# Patient Record
Sex: Female | Born: 1955
Health system: Southern US, Community
[De-identification: ages and names within clinical notes are randomized; demographics above are authoritative.]

## PROBLEM LIST (undated history)

## (undated) DIAGNOSIS — Z8669 Personal history of other diseases of the nervous system and sense organs: Secondary | ICD-10-CM

## (undated) DIAGNOSIS — K635 Polyp of colon: Secondary | ICD-10-CM

## (undated) DIAGNOSIS — I10 Essential (primary) hypertension: Secondary | ICD-10-CM

## (undated) DIAGNOSIS — M1990 Unspecified osteoarthritis, unspecified site: Secondary | ICD-10-CM

## (undated) DIAGNOSIS — F32A Depression, unspecified: Secondary | ICD-10-CM

## (undated) DIAGNOSIS — Z8619 Personal history of other infectious and parasitic diseases: Secondary | ICD-10-CM

## (undated) HISTORY — DX: Unspecified osteoarthritis, unspecified site: M19.90

## (undated) HISTORY — PX: COLONOSCOPY: SHX174

## (undated) HISTORY — DX: Polyp of colon: K63.5

## (undated) HISTORY — DX: Personal history of other infectious and parasitic diseases: Z86.19

## (undated) HISTORY — DX: Depression, unspecified: F32.A

## (undated) HISTORY — DX: Personal history of other diseases of the nervous system and sense organs: Z86.69

## (undated) HISTORY — DX: Essential (primary) hypertension: I10

---

## 2000-02-21 ENCOUNTER — Encounter: Payer: Self-pay | Admitting: Obstetrics and Gynecology

## 2000-02-21 ENCOUNTER — Encounter: Admission: RE | Admit: 2000-02-21 | Discharge: 2000-02-21 | Payer: Self-pay | Admitting: Obstetrics and Gynecology

## 2003-01-08 ENCOUNTER — Encounter: Admission: RE | Admit: 2003-01-08 | Discharge: 2003-01-08 | Payer: Self-pay | Admitting: Obstetrics and Gynecology

## 2003-01-08 ENCOUNTER — Encounter: Payer: Self-pay | Admitting: Obstetrics and Gynecology

## 2008-06-27 DIAGNOSIS — K635 Polyp of colon: Secondary | ICD-10-CM

## 2008-06-27 HISTORY — DX: Polyp of colon: K63.5

## 2011-09-06 ENCOUNTER — Ambulatory Visit (INDEPENDENT_AMBULATORY_CARE_PROVIDER_SITE_OTHER): Payer: 59 | Admitting: Internal Medicine

## 2011-09-06 ENCOUNTER — Ambulatory Visit (HOSPITAL_BASED_OUTPATIENT_CLINIC_OR_DEPARTMENT_OTHER)
Admission: RE | Admit: 2011-09-06 | Discharge: 2011-09-06 | Disposition: A | Discharge status: Home / Self Care | Payer: 59 | Source: Ambulatory Visit | Attending: Internal Medicine | Admitting: Internal Medicine

## 2011-09-06 ENCOUNTER — Encounter: Payer: Self-pay | Admitting: Internal Medicine

## 2011-09-06 DIAGNOSIS — R053 Chronic cough: Secondary | ICD-10-CM

## 2011-09-06 DIAGNOSIS — Z79899 Other long term (current) drug therapy: Secondary | ICD-10-CM

## 2011-09-06 DIAGNOSIS — R05 Cough: Secondary | ICD-10-CM | POA: Insufficient documentation

## 2011-09-06 DIAGNOSIS — R059 Cough, unspecified: Secondary | ICD-10-CM

## 2011-09-06 DIAGNOSIS — I10 Essential (primary) hypertension: Secondary | ICD-10-CM

## 2011-09-06 DIAGNOSIS — D126 Benign neoplasm of colon, unspecified: Secondary | ICD-10-CM

## 2011-09-06 DIAGNOSIS — M4120 Other idiopathic scoliosis, site unspecified: Secondary | ICD-10-CM | POA: Insufficient documentation

## 2011-09-06 DIAGNOSIS — K635 Polyp of colon: Secondary | ICD-10-CM | POA: Insufficient documentation

## 2011-09-06 LAB — BASIC METABOLIC PANEL
BUN: 14 mg/dL (ref 6–23)
CO2: 27 mEq/L (ref 19–32)
Calcium: 9.9 mg/dL (ref 8.4–10.5)
Chloride: 101 mEq/L (ref 96–112)
Creat: 0.89 mg/dL (ref 0.50–1.10)
Glucose, Bld: 93 mg/dL (ref 70–99)
Potassium: 4.6 mEq/L (ref 3.5–5.3)
Sodium: 141 mEq/L (ref 135–145)

## 2011-09-06 LAB — TSH: TSH: 2.046 u[IU]/mL (ref 0.350–4.500)

## 2011-09-06 MED ORDER — FLUTICASONE PROPIONATE 50 MCG/ACT NA SUSP: 2.0000 | Freq: Every day | NASAL | Status: DC

## 2011-09-06 MED ORDER — METHYLPREDNISOLONE 4 MG PO KIT: PACK | ORAL | Status: AC

## 2011-09-06 NOTE — Assessment & Plan Note (Signed)
Repeat cxr given previous abnormal findings. Suspect contribution to cough from rhinitis. Attempt medrol dosepak and flonase. F/u in 3 wks for re-evaluation.

## 2011-09-06 NOTE — Progress Notes (Signed)
  Subjective:    Patient ID: Wendy Chambers, female    DOB: [birth year], 56 y.o.   MRN: [id number]  HPI Pt presents to clinic for evaluation of chronic cough and HTN. Known h/o htn well controlled with hctz without adverse effect. bp reviewed normotensive. No known hypokalemia complicating treatment. States 2 year h/o chronic cough intermittently productive without hemoptysis. Past smoker quit in 1990's. Evaluation has included CXR which she was told showed hyperinflation and possibly "fibrosis."  PFT's obtained and suggested "borderline copd." using no inhalers. States was given course of biaxin for sinusitis and noticed the cough improved. Also now taking claritin for 2wks and notes possible improvement of cough. Cough worsens sometimes with strong smells and may have begun after starting her current job. Wonders if mold is in workplace. Denies GERD sx's other than rarely. Mamm/pap utd 2012. H/o colon polyps with 2010 colonoscopy (5 y recommended f/u.) chol reportedly nl.  Past Medical History  Diagnosis Date  . Asthma   . History of chicken pox     childhood  . Hypertension   . History of migraine   . Colon polyp 2010   Past Surgical History  Procedure Date  . Cesarean section 2010    reports that she has quit smoking. She has never used smokeless tobacco. She reports that she drinks alcohol. She reports that she does not use illicit drugs. family history includes Heart disease in her paternal grandmother; Hyperlipidemia in her father and mother; Hypertension in her father; and Lung cancer in her father. Allergies  Allergen Reactions  . Tetracyclines & Related     Rash     Review of Systems  Constitutional: Negative for fever and chills.  Respiratory: Positive for cough. Negative for shortness of breath.   All other systems reviewed and are negative.       Objective:   Physical Exam  Nursing note and vitals reviewed. Constitutional: She appears well-developed and  well-nourished. No distress.  HENT:  Head: Normocephalic and atraumatic.  Right Ear: External ear normal.  Left Ear: External ear normal.  Nose: Mucosal edema and rhinorrhea present.  Mouth/Throat: Oropharynx is clear and moist. No oropharyngeal exudate.  Eyes: Conjunctivae are normal. Right eye exhibits no discharge. Left eye exhibits no discharge. No scleral icterus.  Neck: Neck supple. Carotid bruit is not present. No thyromegaly present.  Cardiovascular: Normal rate, regular rhythm and normal heart sounds.  Exam reveals no gallop and no friction rub.   No murmur heard. Pulmonary/Chest: Effort normal and breath sounds normal. No respiratory distress. She has no wheezes. She has no rales.  Lymphadenopathy:    She has no cervical adenopathy.  Neurological: She is alert.  Skin: Skin is warm and dry. She is not diaphoretic.  Psychiatric: She has a normal mood and affect.          Assessment & Plan:

## 2011-09-06 NOTE — Assessment & Plan Note (Signed)
Normotensive and stable. Continue current regimen. Monitor bp as outpt and followup in clinic as scheduled. Obtain cbc, chem7 and tsh  

## 2011-09-07 LAB — CBC WITH DIFFERENTIAL/PLATELET
Basophils Absolute: 0.1 10*3/uL (ref 0.0–0.1)
Basophils Relative: 1 % (ref 0–1)
Eosinophils Absolute: 0.3 10*3/uL (ref 0.0–0.7)
Eosinophils Relative: 6 % — ABNORMAL HIGH (ref 0–5)
HCT: 42.9 % (ref 36.0–46.0)
Hemoglobin: 14.8 g/dL (ref 12.0–15.0)
Lymphocytes Relative: 40 % (ref 12–46)
Lymphs Abs: 2.1 10*3/uL (ref 0.7–4.0)
MCH: 30.5 pg (ref 26.0–34.0)
MCHC: 34.5 g/dL (ref 30.0–36.0)
MCV: 88.5 fL (ref 78.0–100.0)
Monocytes Absolute: 0.6 10*3/uL (ref 0.1–1.0)
Monocytes Relative: 11 % (ref 3–12)
Neutro Abs: 2.2 10*3/uL (ref 1.7–7.7)
Neutrophils Relative %: 42 % — ABNORMAL LOW (ref 43–77)
Platelets: 330 10*3/uL (ref 150–400)
RBC: 4.85 MIL/uL (ref 3.87–5.11)
RDW: 12.7 % (ref 11.5–15.5)
WBC: 5.2 10*3/uL (ref 4.0–10.5)

## 2011-09-09 ENCOUNTER — Telehealth: Payer: Self-pay | Admitting: Internal Medicine

## 2011-09-09 NOTE — Telephone Encounter (Signed)
Received medical records from [facility] at [facility]

## 2011-09-27 ENCOUNTER — Ambulatory Visit (INDEPENDENT_AMBULATORY_CARE_PROVIDER_SITE_OTHER): Payer: 59 | Admitting: Internal Medicine

## 2011-09-27 ENCOUNTER — Telehealth: Payer: Self-pay | Admitting: Internal Medicine

## 2011-09-27 ENCOUNTER — Encounter: Payer: Self-pay | Admitting: Internal Medicine

## 2011-09-27 VITALS — BP 108/72 | HR 69 | Temp 97.9°F | Resp 18 | Wt 206.0 lb

## 2011-09-27 DIAGNOSIS — R053 Chronic cough: Secondary | ICD-10-CM

## 2011-09-27 DIAGNOSIS — I10 Essential (primary) hypertension: Secondary | ICD-10-CM

## 2011-09-27 DIAGNOSIS — R059 Cough, unspecified: Secondary | ICD-10-CM

## 2011-09-27 DIAGNOSIS — R05 Cough: Secondary | ICD-10-CM

## 2011-09-27 DIAGNOSIS — Z79899 Other long term (current) drug therapy: Secondary | ICD-10-CM

## 2011-09-27 MED ORDER — HYDROCHLOROTHIAZIDE 25 MG PO TABS: 25.0000 mg | ORAL_TABLET | Freq: Every day | ORAL | Status: DC

## 2011-09-27 NOTE — Assessment & Plan Note (Signed)
Normotensive and stable. Continue current regimen. Monitor bp as outpt and followup in clinic as scheduled. Obtain cbc, chem7 prior to next visit 

## 2011-09-27 NOTE — Progress Notes (Signed)
  Subjective:    Patient ID: Wendy Chambers, female    DOB: 1956-05-16, 56 y.o.   MRN: [id number]  HPI Pt presents to clinic for followup of multiple medical problems. States cough has almost entirely resolved now. Is taking both claritin and flonase without difficulty. Reviewed cxr results without chronic changes previously suggested by outside xray. bp reviewed normotensive. Cbc, chem7, and tsh reviewed with pt. No active complaints.   Past Medical History  Diagnosis Date  . Asthma   . History of chicken pox     childhood  . Hypertension   . History of migraine   . Colon polyp 2010   Past Surgical History  Procedure Date  . Cesarean section 2010    reports that she has quit smoking. She has never used smokeless tobacco. She reports that she drinks alcohol. She reports that she does not use illicit drugs. family history includes Heart disease in her paternal grandmother; Hyperlipidemia in her father and mother; Hypertension in her father; and Lung cancer in her father. Allergies  Allergen Reactions  . Tetracyclines & Related     Rash      Review of Systems see hpi     Objective:   Physical Exam  Nursing note and vitals reviewed. Constitutional: She appears well-developed and well-nourished.  HENT:  Head: Normocephalic and atraumatic.  Right Ear: External ear normal.  Left Ear: External ear normal.  Neurological: She is alert.  Psychiatric: She has a normal mood and affect.          Assessment & Plan:

## 2011-09-27 NOTE — Assessment & Plan Note (Signed)
Resolving. Continue flonase/claritin for now but consider staged trial off.

## 2011-09-27 NOTE — Patient Instructions (Signed)
Please schedule fasting labs prior to next visit chem7-v58.69, lipid-401.9

## 2011-09-27 NOTE — Telephone Encounter (Signed)
Lab orders entered for September 2013. 

## 2012-03-28 ENCOUNTER — Ambulatory Visit: Payer: 59 | Admitting: Internal Medicine

## 2012-04-16 ENCOUNTER — Ambulatory Visit (INDEPENDENT_AMBULATORY_CARE_PROVIDER_SITE_OTHER): Payer: 59 | Admitting: Internal Medicine

## 2012-04-16 ENCOUNTER — Encounter: Payer: Self-pay | Admitting: Internal Medicine

## 2012-04-16 VITALS — BP 128/98 | HR 63 | Temp 98.0°F | Resp 16 | Ht 67.0 in | Wt 184.0 lb

## 2012-04-16 DIAGNOSIS — I10 Essential (primary) hypertension: Secondary | ICD-10-CM

## 2012-04-16 DIAGNOSIS — F329 Major depressive disorder, single episode, unspecified: Secondary | ICD-10-CM

## 2012-04-16 DIAGNOSIS — F32A Depression, unspecified: Secondary | ICD-10-CM

## 2012-04-16 DIAGNOSIS — Z79899 Other long term (current) drug therapy: Secondary | ICD-10-CM

## 2012-04-16 LAB — BASIC METABOLIC PANEL
BUN: 11 mg/dL (ref 6–23)
Calcium: 9.7 mg/dL (ref 8.4–10.5)
Creat: 0.92 mg/dL (ref 0.50–1.10)

## 2012-04-16 MED ORDER — CITALOPRAM HYDROBROMIDE 20 MG PO TABS: 20.0000 mg | ORAL_TABLET | Freq: Every day | ORAL | Status: DC

## 2012-04-21 DIAGNOSIS — F418 Other specified anxiety disorders: Secondary | ICD-10-CM | POA: Insufficient documentation

## 2012-04-21 NOTE — Assessment & Plan Note (Signed)
Isolated elevation pt relates to stress. Recommend outpt bp log. Obtain chem7

## 2012-04-21 NOTE — Progress Notes (Signed)
  Subjective:    Patient ID: Shanielle Correll, female    DOB: [birth year], 56 y.o.   MRN: [id number]  HPI Pt presents to clinic for followup of multiple medical problems. Notes decreased mood without si/hi. Has work stress but is looking for a new job. BP elevated today but related to this stress. Declines influenza vaccine.  Past Medical History  Diagnosis Date  . Asthma   . History of chicken pox     childhood  . Hypertension   . History of migraine   . Colon polyp 2010   Past Surgical History  Procedure Date  . Cesarean section 2010    reports that she has quit smoking. She has never used smokeless tobacco. She reports that she drinks alcohol. She reports that she does not use illicit drugs. family history includes Heart disease in her paternal grandmother; Hyperlipidemia in her father and mother; Hypertension in her father; and Lung cancer in her father. Allergies  Allergen Reactions  . Tetracyclines & Related     Rash      Review of Systems see hpi     Objective:   Physical Exam  Physical Exam  Nursing note and vitals reviewed. Constitutional: Appears well-developed and well-nourished. No distress.  HENT:  Head: Normocephalic and atraumatic.  Right Ear: External ear normal.  Left Ear: External ear normal.  Eyes: Conjunctivae are normal. No scleral icterus.  Neck: Neck supple. Carotid bruit is not present.  Cardiovascular: Normal rate, regular rhythm and normal heart sounds.  Exam reveals no gallop and no friction rub.   No murmur heard. Pulmonary/Chest: Effort normal and breath sounds normal. No respiratory distress. He has no wheezes. no rales.  Lymphadenopathy:    He has no cervical adenopathy.  Neurological:Alert.  Skin: Skin is warm and dry. Not diaphoretic.  Psychiatric: Has a normal mood and affect. no si/hi.       Assessment & Plan:

## 2012-04-21 NOTE — Assessment & Plan Note (Signed)
Begin celexa 20mg  qd. Schedule close f/u.

## 2012-05-17 ENCOUNTER — Ambulatory Visit: Payer: 59 | Admitting: Internal Medicine

## 2012-05-28 ENCOUNTER — Ambulatory Visit (INDEPENDENT_AMBULATORY_CARE_PROVIDER_SITE_OTHER): Payer: 59 | Admitting: Internal Medicine

## 2012-05-28 ENCOUNTER — Encounter: Payer: Self-pay | Admitting: Internal Medicine

## 2012-05-28 VITALS — BP 124/82 | HR 64 | Temp 98.2°F | Resp 16 | Wt 185.5 lb

## 2012-05-28 DIAGNOSIS — I10 Essential (primary) hypertension: Secondary | ICD-10-CM

## 2012-05-28 DIAGNOSIS — F32A Depression, unspecified: Secondary | ICD-10-CM

## 2012-05-28 DIAGNOSIS — F329 Major depressive disorder, single episode, unspecified: Secondary | ICD-10-CM

## 2012-05-28 MED ORDER — CITALOPRAM HYDROBROMIDE 20 MG PO TABS: 20.0000 mg | ORAL_TABLET | Freq: Every day | ORAL | Status: DC

## 2012-05-28 MED ORDER — CLOBETASOL PROPIONATE 0.05 % EX CREA: 1.0000 "application " | TOPICAL_CREAM | Freq: Two times a day (BID) | CUTANEOUS | Status: DC

## 2012-05-28 NOTE — Progress Notes (Signed)
  Subjective:    Patient ID: Wendy Chambers, female    DOB: Dec 20, 1955, 56 y.o.   MRN: [id number]  HPI Pt presents to clinic for followup of multiple medical problems. Now tolerating ssri. Did initially have nighttime palpitations. Mood improved and sleeping better. BP reviewed normotensive.  Past Medical History  Diagnosis Date  . Asthma   . History of chicken pox     childhood  . Hypertension   . History of migraine   . Colon polyp 2010   Past Surgical History  Procedure Date  . Cesarean section 2010    reports that she has quit smoking. She has never used smokeless tobacco. She reports that she drinks alcohol. She reports that she does not use illicit drugs. family history includes Heart disease in her paternal grandmother; Hyperlipidemia in her father and mother; Hypertension in her father; and Lung cancer in her father. Allergies  Allergen Reactions  . Tetracyclines & Related     Rash      Review of Systems see hpi    Objective:   Physical Exam  Nursing note and vitals reviewed. Constitutional: She appears well-developed and well-nourished. No distress.  HENT:  Head: Normocephalic and atraumatic.  Eyes: Conjunctivae normal are normal. No scleral icterus.  Neurological: She is alert.  Skin: She is not diaphoretic.  Psychiatric: She has a normal mood and affect. Her behavior is normal. Thought content normal.          Assessment & Plan:

## 2012-05-28 NOTE — Addendum Note (Signed)
Addended by: Regis Bill on: 05/28/2012 01:18 PM   Modules accepted: Orders

## 2012-05-28 NOTE — Telephone Encounter (Signed)
Release lab order/SLS

## 2012-05-29 LAB — LIPID PANEL
Cholesterol: 170 mg/dL (ref 0–200)
LDL Cholesterol: 97 mg/dL (ref 0–99)
Triglycerides: 77 mg/dL (ref <150)

## 2012-06-02 NOTE — Assessment & Plan Note (Signed)
Improved. Consider decrease of dose.

## 2012-06-02 NOTE — Assessment & Plan Note (Signed)
Normotensive and stable. Continue current regimen. Monitor bp as outpt and followup in clinic as scheduled.  

## 2012-06-27 LAB — HM COLONOSCOPY

## 2012-07-14 ENCOUNTER — Other Ambulatory Visit: Payer: Self-pay | Admitting: Pulmonary Disease

## 2012-08-31 ENCOUNTER — Ambulatory Visit: Payer: 59 | Admitting: Internal Medicine

## 2012-10-27 ENCOUNTER — Other Ambulatory Visit: Payer: Self-pay | Admitting: Internal Medicine

## 2012-10-29 NOTE — Telephone Encounter (Signed)
Rx sent in to pharmacy. 

## 2013-01-23 ENCOUNTER — Ambulatory Visit: Payer: 59 | Admitting: Family

## 2013-01-23 ENCOUNTER — Ambulatory Visit (INDEPENDENT_AMBULATORY_CARE_PROVIDER_SITE_OTHER): Payer: 59 | Admitting: Family

## 2013-01-23 ENCOUNTER — Encounter: Payer: Self-pay | Admitting: Family

## 2013-01-23 VITALS — BP 128/80 | HR 76 | Temp 98.1°F | Resp 16 | Wt 198.1 lb

## 2013-01-23 DIAGNOSIS — I10 Essential (primary) hypertension: Secondary | ICD-10-CM

## 2013-01-23 DIAGNOSIS — R221 Localized swelling, mass and lump, neck: Secondary | ICD-10-CM

## 2013-01-23 DIAGNOSIS — H60549 Acute eczematoid otitis externa, unspecified ear: Secondary | ICD-10-CM | POA: Insufficient documentation

## 2013-01-23 DIAGNOSIS — H60543 Acute eczematoid otitis externa, bilateral: Secondary | ICD-10-CM

## 2013-01-23 DIAGNOSIS — R220 Localized swelling, mass and lump, head: Secondary | ICD-10-CM

## 2013-01-23 DIAGNOSIS — F32A Depression, unspecified: Secondary | ICD-10-CM

## 2013-01-23 DIAGNOSIS — F329 Major depressive disorder, single episode, unspecified: Secondary | ICD-10-CM

## 2013-01-23 DIAGNOSIS — H60509 Unspecified acute noninfective otitis externa, unspecified ear: Secondary | ICD-10-CM

## 2013-01-23 LAB — BASIC METABOLIC PANEL
BUN: 9 mg/dL (ref 6–23)
CO2: 30 mEq/L (ref 19–32)
Chloride: 102 mEq/L (ref 96–112)
Creat: 0.95 mg/dL (ref 0.50–1.10)
Glucose, Bld: 133 mg/dL — ABNORMAL HIGH (ref 70–99)

## 2013-01-23 MED ORDER — HYDROCHLOROTHIAZIDE 25 MG PO TABS: 25.0000 mg | ORAL_TABLET | Freq: Every day | ORAL | Status: DC

## 2013-01-23 MED ORDER — BETAMETHASONE DIPROPIONATE 0.05 % EX CREA: TOPICAL_CREAM | Freq: Two times a day (BID) | CUTANEOUS | Status: DC | PRN

## 2013-01-23 NOTE — Assessment & Plan Note (Signed)
Recommended diprolene cream prn.  After further chart review, I see that she has rx for temovate which she uses for a derm issue on her foot.  Advised pt that she could use temovate or diprolene for the ear eczema.  She verbalizes understanding.

## 2013-01-23 NOTE — Patient Instructions (Signed)
Complete your lab work prior to leaving. You will be contacted about your referral to ENT. Please follow up in 6 months for a fasting physical.

## 2013-01-23 NOTE — Assessment & Plan Note (Signed)
Stable on hctz, continue same. Obtain bmet.

## 2013-01-23 NOTE — Assessment & Plan Note (Signed)
Enlarging. Will refer to ENT for excision.

## 2013-01-23 NOTE — Progress Notes (Signed)
  Subjective:    Patient ID: Wendy Chambers, female    DOB: [birth year], 57 y.o.   MRN: [id number]  HPI  Wendy Chambers is a 57 yr old female who presents today with several concerns:  1) Cyst behind the left neck- has been present for years and is enlarging.  2) Depression- stopped citalopram due to palpitations. Feels well off of medication.   3) HTN-  Currently maintained on hctz. Denies cp sob or swelling.   4) Ear problem- itching and flaking of the right ear for a few weeks.    Review of Systems See HPI  Past Medical History  Diagnosis Date  . Asthma   . History of chicken pox     childhood  . Hypertension   . History of migraine   . Colon polyp 2010    History   Social History  . Marital Status: Legally Separated    Spouse Name: N/A    Number of Children: N/A  . Years of Education: N/A   Occupational History  . Not on file.   Social History Main Topics  . Smoking status: Former Games developer  . Smokeless tobacco: Never Used     Comment: Quit 1990 - 1 ppd  . Alcohol Use: Yes  . Drug Use: No  . Sexually Active: Not on file   Other Topics Concern  . Not on file   Social History Narrative  . No narrative on file    Past Surgical History  Procedure Laterality Date  . Cesarean section  2010    Family History  Problem Relation Age of Onset  . Lung cancer Father   . Hyperlipidemia Mother   . Hyperlipidemia Father   . Heart disease Paternal Grandmother   . Hypertension Father     Allergies  Allergen Reactions  . Tetracyclines & Related     Rash    Current Outpatient Prescriptions on File Prior to Visit  Medication Sig Dispense Refill  . citalopram (CELEXA) 20 MG tablet Take 1 tablet (20 mg total) by mouth daily.  30 tablet  6  . clobetasol cream (TEMOVATE) 0.05 % Apply 1 application topically 2 (two) times daily.  30 g  1  . fluticasone (FLONASE) 50 MCG/ACT nasal spray USE 2 SPRAYS EACH NOSTRIL DAILY  16 g  5  . hydrochlorothiazide  (HYDRODIURIL) 25 MG tablet Take 1 tablet (25 mg total) by mouth daily.  90 tablet  0   No current facility-administered medications on file prior to visit.    BP 128/80  Pulse 76  Temp(Src) 98.1 F (36.7 C) (Oral)  Resp 16  Wt 198 lb 1.9 oz (89.867 kg)  BMI 31.02 kg/m2  SpO2 99%       Objective:   Physical Exam  Constitutional: She appears well-developed and well-nourished. No distress.  Eyes: No scleral icterus.  Neck: Normal range of motion. Neck supple.  Firm, mobile, rubbery neck mass left posterior neck.  Skin:  Dry flaking skin noted bilateral ear canals and behind right Pinna          Assessment & Plan:

## 2013-01-23 NOTE — Assessment & Plan Note (Signed)
Stable off of meds, monitor.  

## 2013-05-02 ENCOUNTER — Other Ambulatory Visit: Payer: Self-pay

## 2013-06-18 ENCOUNTER — Ambulatory Visit (INDEPENDENT_AMBULATORY_CARE_PROVIDER_SITE_OTHER): Payer: 59 | Admitting: Family

## 2013-06-18 ENCOUNTER — Encounter: Payer: Self-pay | Admitting: Family

## 2013-06-18 VITALS — BP 140/80 | HR 62 | Temp 98.2°F | Resp 16 | Ht 67.0 in | Wt 206.0 lb

## 2013-06-18 DIAGNOSIS — R220 Localized swelling, mass and lump, head: Secondary | ICD-10-CM

## 2013-06-18 DIAGNOSIS — M25512 Pain in left shoulder: Secondary | ICD-10-CM

## 2013-06-18 DIAGNOSIS — R221 Localized swelling, mass and lump, neck: Secondary | ICD-10-CM

## 2013-06-18 DIAGNOSIS — M25519 Pain in unspecified shoulder: Secondary | ICD-10-CM

## 2013-06-18 MED ORDER — MELOXICAM 7.5 MG PO TABS: 7.5000 mg | ORAL_TABLET | Freq: Every day | ORAL | Status: DC

## 2013-06-18 NOTE — Assessment & Plan Note (Signed)
Due to duration of symptoms, Will refer to sports medicine. Rx with meloxicam.

## 2013-06-18 NOTE — Progress Notes (Signed)
Pre visit review using our clinic review tool, if applicable. No additional management support is needed unless otherwise documented below in the visit note. 

## 2013-06-18 NOTE — Progress Notes (Signed)
   Subjective:    Patient ID: Wendy Chambers, female    DOB: 12/16/1955, 57 y.o.   MRN: [id number]  HPI  Wendy Chambers is a 57 yr old female who presents today with chief complaint of shoulder pain.  Pain is located in the left shoulder and has been present x 3-4 months.  Denies known injury.  Reports pain is worse when she pulls her left arm back Has used advil without much improvement in her symptoms.   Neck mass- She did not keep her appointment with ENT.  Mass is unchanged.    Review of Systems See HPI  Past Medical History  Diagnosis Date  . Asthma   . History of chicken pox     childhood  . Hypertension   . History of migraine   . Colon polyp 2010    History   Social History  . Marital Status: Legally Separated    Spouse Name: N/A    Number of Children: N/A  . Years of Education: N/A   Occupational History  . Not on file.   Social History Main Topics  . Smoking status: Former Games developer  . Smokeless tobacco: Never Used     Comment: Quit 1990 - 1 ppd  . Alcohol Use: Yes  . Drug Use: No  . Sexual Activity: Not on file   Other Topics Concern  . Not on file   Social History Narrative  . No narrative on file    Past Surgical History  Procedure Laterality Date  . Cesarean section  2010    Family History  Problem Relation Age of Onset  . Lung cancer Father   . Hyperlipidemia Mother   . Hyperlipidemia Father   . Heart disease Paternal Grandmother   . Hypertension Father     Allergies  Allergen Reactions  . Tetracyclines & Related     Rash    Current Outpatient Prescriptions on File Prior to Visit  Medication Sig Dispense Refill  . hydrochlorothiazide (HYDRODIURIL) 25 MG tablet Take 1 tablet (25 mg total) by mouth daily.  90 tablet  1   No current facility-administered medications on file prior to visit.    BP 140/80  Pulse 62  Temp(Src) 98.2 F (36.8 C) (Oral)  Resp 16  Ht 5\' 7"  (1.702 m)  Wt 206 lb (93.441 kg)  BMI 32.26 kg/m2  SpO2  99%       Objective:   Physical Exam  Constitutional: She appears well-developed and well-nourished. No distress.  Neck:  Firm, mobile, rubbery neck mass left posterior neck.   Cardiovascular: Normal rate and regular rhythm.   No murmur heard. Pulmonary/Chest: Effort normal and breath sounds normal. No respiratory distress. She has no wheezes. She has no rales. She exhibits no tenderness.  Musculoskeletal:       Left shoulder: She exhibits decreased range of motion. She exhibits no tenderness, no swelling, no effusion and no crepitus.          Assessment & Plan:

## 2013-06-18 NOTE — Patient Instructions (Signed)
You will be contacted about your referral to sports medicine.  Please let us know if you have not heard back within 1 week about your referral. Please reschedule you appointment with ENT at your earliest convenience- Dr. Verne Spurr [phone]

## 2013-06-18 NOTE — Assessment & Plan Note (Signed)
Unchanged. Advised pt to reschedule ENT appointment at her her earliest convenience.

## 2013-06-25 ENCOUNTER — Encounter: Payer: Self-pay | Admitting: Family Medicine

## 2013-06-25 ENCOUNTER — Ambulatory Visit (INDEPENDENT_AMBULATORY_CARE_PROVIDER_SITE_OTHER): Payer: 59 | Admitting: Family Medicine

## 2013-06-25 VITALS — BP 140/90 | HR 79 | Ht 67.0 in | Wt 205.0 lb

## 2013-06-25 DIAGNOSIS — M25512 Pain in left shoulder: Secondary | ICD-10-CM

## 2013-06-25 DIAGNOSIS — M25519 Pain in unspecified shoulder: Secondary | ICD-10-CM

## 2013-06-25 MED ORDER — MELOXICAM 7.5 MG PO TABS: 7.5000 mg | ORAL_TABLET | Freq: Every day | ORAL | Status: DC

## 2013-06-25 NOTE — Patient Instructions (Signed)
Your exam is consistent with a degenerative labral tear of your shoulder. Try to avoid painful activities as much as possible. Meloxicam with food for pain and inflammation. Can take tylenol in addition to this. Do home exercise program with theraband and scapular stabilization exercises daily 3 sets of 10. If not improving at follow-up we will consider further imaging, physical therapy, injection.

## 2013-06-26 ENCOUNTER — Encounter: Payer: Self-pay | Admitting: Family Medicine

## 2013-06-27 ENCOUNTER — Encounter: Payer: Self-pay | Admitting: Family Medicine

## 2013-06-27 NOTE — Progress Notes (Signed)
Patient ID: Andre Gallego, female   DOB: [birth year], 58 y.o.   MRN: [id number]  PCP: Nance Pear., NP  Subjective:   HPI: Patient is a 58 y.o. female here for left shoulder pain.  Patient reports she's had left shoulder pain for about 4 months. Thought she slept on shoulder wrong. No injury or trauma. Pain primarily when reaching behind her. Pushing up out of a chair also painful. No numbness or tingling. Tried ibuprofen and meloxicam. No prior issues. She is right handed.  Past Medical History  Diagnosis Date  . Asthma   . History of chicken pox     childhood  . Hypertension   . History of migraine   . Colon polyp 2010    Current Outpatient Prescriptions on File Prior to Visit  Medication Sig Dispense Refill  . hydrochlorothiazide (HYDRODIURIL) 25 MG tablet Take 1 tablet (25 mg total) by mouth daily.  90 tablet  1   No current facility-administered medications on file prior to visit.    Past Surgical History  Procedure Laterality Date  . Cesarean section  2010    Allergies  Allergen Reactions  . Tetracyclines & Related     Rash    History   Social History  . Marital Status: Legally Separated    Spouse Name: N/A    Number of Children: N/A  . Years of Education: N/A   Occupational History  . Not on file.   Social History Main Topics  . Smoking status: Former Research scientist (life sciences)  . Smokeless tobacco: Never Used     Comment: Quit 1990 - 1 ppd  . Alcohol Use: Yes  . Drug Use: No  . Sexual Activity: Not on file   Other Topics Concern  . Not on file   Social History Narrative  . No narrative on file    Family History  Problem Relation Age of Onset  . Lung cancer Father   . Hyperlipidemia Father   . Hypertension Father   . Hyperlipidemia Mother   . Heart disease Paternal Grandmother   . Diabetes Neg Hx   . Heart attack Neg Hx     BP 140/90  Pulse 79  Ht 5\' 7"  (1.702 m)  Wt 205 lb (92.987 kg)  BMI 32.10 kg/m2  Review of Systems: See  HPI above.    Objective:  Physical Exam:  Gen: NAD  L shoulder: No swelling, ecchymoses.  No gross deformity. No TTP. FROM - pain with extension. Negative Hawkins, Neers. Negative Speeds, Yergasons. Strength 5/5 with empty can and resisted internal/external rotation.  Not painful. Negative apprehension. Positive o'briens. NV intact distally.    Assessment & Plan:  1. Left shoulder pain - consistent with degenerative labral tear of shoulder.  Will attempt conservative treatment - HEP, meloxicam with tylenol as needed.  Declined injection, formal PT for now.  Consider further imaging, PT, injection if not improving over next month to 6 weeks.

## 2013-06-27 NOTE — Assessment & Plan Note (Signed)
consistent with degenerative labral tear of shoulder.  Will attempt conservative treatment - HEP, meloxicam with tylenol as needed.  Declined injection, formal PT for now.  Consider further imaging, PT, injection if not improving over next month to 6 weeks.

## 2013-07-26 ENCOUNTER — Encounter: Payer: 59 | Admitting: Family Medicine

## 2013-08-06 ENCOUNTER — Ambulatory Visit: Payer: 59 | Admitting: Family Medicine

## 2013-08-13 ENCOUNTER — Encounter: Payer: 59 | Admitting: Family

## 2013-08-21 ENCOUNTER — Encounter: Payer: 59 | Admitting: Family

## 2013-08-27 ENCOUNTER — Other Ambulatory Visit: Payer: Self-pay | Admitting: Family

## 2013-08-28 ENCOUNTER — Encounter: Payer: Self-pay | Admitting: Family

## 2013-08-28 ENCOUNTER — Ambulatory Visit (INDEPENDENT_AMBULATORY_CARE_PROVIDER_SITE_OTHER): Payer: 59 | Admitting: Family

## 2013-08-28 ENCOUNTER — Ambulatory Visit (HOSPITAL_BASED_OUTPATIENT_CLINIC_OR_DEPARTMENT_OTHER)
Admission: RE | Admit: 2013-08-28 | Discharge: 2013-08-28 | Disposition: A | Discharge status: Home / Self Care | Payer: 59 | Source: Ambulatory Visit | Attending: Family | Admitting: Family

## 2013-08-28 VITALS — BP 132/90 | HR 76 | Temp 98.0°F | Resp 16 | Ht 67.0 in | Wt 209.0 lb

## 2013-08-28 DIAGNOSIS — M25519 Pain in unspecified shoulder: Secondary | ICD-10-CM

## 2013-08-28 DIAGNOSIS — M542 Cervicalgia: Secondary | ICD-10-CM | POA: Insufficient documentation

## 2013-08-28 DIAGNOSIS — R220 Localized swelling, mass and lump, head: Secondary | ICD-10-CM

## 2013-08-28 DIAGNOSIS — R221 Localized swelling, mass and lump, neck: Secondary | ICD-10-CM

## 2013-08-28 MED ORDER — METHYLPREDNISOLONE 4 MG PO KIT: PACK | ORAL | Status: DC

## 2013-08-28 MED ORDER — CYCLOBENZAPRINE HCL 5 MG PO TABS: 5.0000 mg | ORAL_TABLET | Freq: Every evening | ORAL | Status: DC | PRN

## 2013-08-28 NOTE — Assessment & Plan Note (Signed)
Likely secondary to OA/DDD of C spine.  Obtain xray of c spine. Rx with medrol dose pak and HS flexeril- short term.

## 2013-08-28 NOTE — Assessment & Plan Note (Signed)
Will re-initiate referral to ENT.

## 2013-08-28 NOTE — Progress Notes (Signed)
Pre visit review using our clinic review tool, if applicable. No additional management support is needed unless otherwise documented below in the visit note. 

## 2013-08-28 NOTE — Assessment & Plan Note (Signed)
Seems musculoskeletal in nature.  Advised her to follow up with Dr. Barbaraann Barthel.

## 2013-08-28 NOTE — Patient Instructions (Signed)
Complete neck x ray on the first floor. Start medrol dose pak. You may use cyclobenzaprine (flexeril) at bedtime as needed. You will be contacted about your referral to ENT.  Please let us know if you have not heard back in 1 week.   Call if symptoms worsen, or if not improved in 1 week.

## 2013-08-28 NOTE — Progress Notes (Signed)
Subjective:    Patient ID: Wendy Chambers, female    DOB: June 24, 1956, 58 y.o.   MRN: [id number]  HPI  Wendy Chambers is a 58 yr old female who presents today with chief complaint of neck pain.   She reports daily headaches and a "grinding sound" when she turns her head.    L Shoulder pain- She did see Dr. Barbaraann Barthel for shoulder pain.  Pain is worse with flexion. Did exercises without much improvement. Meloxicam helps.  She recently restarted meloxicam.  L neck mass- she first brought this to our attention in July 2014.  She was referred to ENT at that time. She was scheduled to see Dr. Iva Boop at that time. At her follow up appointment in December 2014, she was advised to reschedule her appointment. She did not remember to do this.   Review of Systems See HPI  Past Medical History  Diagnosis Date  . Asthma   . History of chicken pox     childhood  . Hypertension   . History of migraine   . Colon polyp 2010    History   Social History  . Marital Status: Legally Separated    Spouse Name: N/A    Number of Children: N/A  . Years of Education: N/A   Occupational History  . Not on file.   Social History Main Topics  . Smoking status: Former Research scientist (life sciences)  . Smokeless tobacco: Never Used     Comment: Quit 1990 - 1 ppd  . Alcohol Use: Yes  . Drug Use: No  . Sexual Activity: Not on file   Other Topics Concern  . Not on file   Social History Narrative  . No narrative on file    Past Surgical History  Procedure Laterality Date  . Cesarean section  2010    Family History  Problem Relation Age of Onset  . Lung cancer Father   . Hyperlipidemia Father   . Hypertension Father   . Hyperlipidemia Mother   . Heart disease Paternal Grandmother   . Diabetes Neg Hx   . Heart attack Neg Hx     Allergies  Allergen Reactions  . Tetracyclines & Related     Rash    Current Outpatient Prescriptions on File Prior to Visit  Medication Sig Dispense Refill  .  hydrochlorothiazide (HYDRODIURIL) 25 MG tablet TAKE ONE TABLET BY MOUTH ONE TIME DAILY  30 tablet  2  . meloxicam (MOBIC) 7.5 MG tablet Take 1 tablet (7.5 mg total) by mouth daily.  30 tablet  1   No current facility-administered medications on file prior to visit.    BP 132/90  Pulse 76  Temp(Src) 98 F (36.7 C) (Oral)  Resp 16  Ht 5\' 7"  (1.702 m)  Wt 209 lb 0.6 oz (94.82 kg)  BMI 32.73 kg/m2  SpO2 99%       Objective:   Physical Exam  Constitutional: She is oriented to person, place, and time. She appears well-developed and well-nourished. No distress.  Neck:  + approx 1 inch wide firm, non-tender rubber mass noted left occipital area.  Cardiovascular: Normal rate and regular rhythm.   No murmur heard. Pulmonary/Chest: Effort normal and breath sounds normal. No respiratory distress. She has no wheezes. She has no rales. She exhibits no tenderness.  Neurological: She is alert and oriented to person, place, and time.  Reflex Scores:      Tricep reflexes are 2+ on the right side and 2+ on the  left side.      Bicep reflexes are 2+ on the right side and 2+ on the left side.      Patellar reflexes are 2+ on the right side and 2+ on the left side. Bilateral UE/LE strength is 5/5  Psychiatric: She has a normal mood and affect. Her behavior is normal. Judgment and thought content normal.          Assessment & Plan:

## 2013-09-09 ENCOUNTER — Ambulatory Visit (HOSPITAL_BASED_OUTPATIENT_CLINIC_OR_DEPARTMENT_OTHER)
Admission: RE | Admit: 2013-09-09 | Discharge: 2013-09-09 | Disposition: A | Discharge status: Home / Self Care | Payer: 59 | Source: Ambulatory Visit | Attending: Family | Admitting: Family

## 2013-09-09 ENCOUNTER — Other Ambulatory Visit: Payer: Self-pay | Admitting: Family

## 2013-09-09 ENCOUNTER — Ambulatory Visit (INDEPENDENT_AMBULATORY_CARE_PROVIDER_SITE_OTHER): Payer: 59 | Admitting: Family

## 2013-09-09 ENCOUNTER — Encounter: Payer: Self-pay | Admitting: Family

## 2013-09-09 VITALS — BP 124/72 | HR 77 | Temp 97.7°F | Resp 16 | Ht 67.5 in | Wt 207.0 lb

## 2013-09-09 DIAGNOSIS — Z0000 Encounter for general adult medical examination without abnormal findings: Secondary | ICD-10-CM

## 2013-09-09 DIAGNOSIS — F32A Depression, unspecified: Secondary | ICD-10-CM

## 2013-09-09 DIAGNOSIS — Z23 Encounter for immunization: Secondary | ICD-10-CM

## 2013-09-09 DIAGNOSIS — F3289 Other specified depressive episodes: Secondary | ICD-10-CM

## 2013-09-09 DIAGNOSIS — Z1231 Encounter for screening mammogram for malignant neoplasm of breast: Secondary | ICD-10-CM

## 2013-09-09 DIAGNOSIS — F329 Major depressive disorder, single episode, unspecified: Secondary | ICD-10-CM

## 2013-09-09 LAB — CBC WITH DIFFERENTIAL/PLATELET
Basophils Absolute: 0 10*3/uL (ref 0.0–0.1)
Basophils Relative: 0 % (ref 0–1)
EOS ABS: 0.2 10*3/uL (ref 0.0–0.7)
Eosinophils Relative: 3 % (ref 0–5)
HCT: 43.6 % (ref 36.0–46.0)
HEMOGLOBIN: 15.3 g/dL — AB (ref 12.0–15.0)
LYMPHS ABS: 0.9 10*3/uL (ref 0.7–4.0)
LYMPHS PCT: 11 % — AB (ref 12–46)
MCH: 30.7 pg (ref 26.0–34.0)
MCHC: 35.1 g/dL (ref 30.0–36.0)
MCV: 87.4 fL (ref 78.0–100.0)
MONOS PCT: 11 % (ref 3–12)
Monocytes Absolute: 0.9 10*3/uL (ref 0.1–1.0)
NEUTROS PCT: 75 % (ref 43–77)
Neutro Abs: 6.1 10*3/uL (ref 1.7–7.7)
PLATELETS: 322 10*3/uL (ref 150–400)
RBC: 4.99 MIL/uL (ref 3.87–5.11)
RDW: 13.3 % (ref 11.5–15.5)
WBC: 8.1 10*3/uL (ref 4.0–10.5)

## 2013-09-09 LAB — LIPID PANEL
CHOL/HDL RATIO: 3.4 ratio
Cholesterol: 182 mg/dL (ref 0–200)
HDL: 53 mg/dL (ref >39)
LDL CALC: 99 mg/dL (ref 0–99)
Triglycerides: 150 mg/dL — ABNORMAL HIGH (ref <150)
VLDL: 30 mg/dL (ref 0–40)

## 2013-09-09 LAB — BASIC METABOLIC PANEL WITH GFR
BUN: 16 mg/dL (ref 6–23)
CALCIUM: 9.5 mg/dL (ref 8.4–10.5)
CO2: 30 meq/L (ref 19–32)
Chloride: 99 mEq/L (ref 96–112)
Creat: 0.84 mg/dL (ref 0.50–1.10)
GFR, Est Non African American: 78 mL/min
GLUCOSE: 87 mg/dL (ref 70–99)
POTASSIUM: 4.3 meq/L (ref 3.5–5.3)
SODIUM: 138 meq/L (ref 135–145)

## 2013-09-09 LAB — HEPATIC FUNCTION PANEL
ALK PHOS: 72 U/L (ref 39–117)
ALT: 14 U/L (ref 0–35)
AST: 13 U/L (ref 0–37)
Albumin: 4.2 g/dL (ref 3.5–5.2)
BILIRUBIN DIRECT: 0.2 mg/dL (ref 0.0–0.3)
BILIRUBIN INDIRECT: 0.7 mg/dL (ref 0.2–1.2)
BILIRUBIN TOTAL: 0.9 mg/dL (ref 0.2–1.2)
Total Protein: 6.6 g/dL (ref 6.0–8.3)

## 2013-09-09 LAB — TSH: TSH: 2.049 u[IU]/mL (ref 0.350–4.500)

## 2013-09-09 MED ORDER — VENLAFAXINE HCL 37.5 MG PO TABS: ORAL_TABLET | ORAL | Status: DC

## 2013-09-09 NOTE — Assessment & Plan Note (Signed)
Deteriorated. Will initiate Effexor. We discussed common side effects such as nausea, drowsiness and weight gain.  Also discussed rare but serious side effect of suicide ideation.  She is instructed to discontinue medication go directly to ED if this occurs.  Pt verbalizes understanding.  Plan follow up in 1 month to evaluate progress.

## 2013-09-09 NOTE — Assessment & Plan Note (Addendum)
She will have Pap with GYN. We discussed healthy diet, exercise. Tdap today.  EKG today, fasting labs, refer for mammogram.

## 2013-09-09 NOTE — Patient Instructions (Signed)
Try to get regular exercise- goal is 30 minutes (walking) 5 days a week. Complete lab work prior to leaving. Your bone density will be set up for you at the front desk. Schedule mammogram on the first floor. Follow up with GYN for Pap and IUD removal. Start Effexor. Follow up in 1 month.

## 2013-09-09 NOTE — Progress Notes (Signed)
Subjective:    Patient ID: Wendy Chambers, female    DOB: 1955/07/21, 59 y.o.   MRN: [id number]  HPI  Wendy Chambers is a 58 yr old female who presents today for cpx.  Immunizations: unsure of last tetanus Diet: healthy Exercise: not enough Colonoscopy:  Up to date Dexa: due Pap Smear:1-2 years ago- will see GYN  Mammogram: due  Reports feeling like  Denies SI/HI   Review of Systems  Constitutional:       Wt Readings from Last 3 Encounters: 09/09/13 : 207 lb (93.895 kg) 08/28/13 : 209 lb 0.6 oz (94.82 kg) 06/25/13 : 205 lb (92.987 kg)   Eyes: Negative for visual disturbance.  Respiratory: Negative for shortness of breath.        Mild cough, URI  Cardiovascular: Negative for chest pain.  Gastrointestinal: Negative for nausea, vomiting and diarrhea.  Genitourinary:       Has IUD, no periods  Musculoskeletal: Negative for myalgias.       Left shoulder pain  Neurological:       Rare dizzy spells every few days  Hematological: Negative for adenopathy.       Neck mass- for excision with ENT  Psychiatric/Behavioral:       Denies panic attacks.  Falls asleep, then wakes up and can't fall back to sleep   Past Medical History  Diagnosis Date  . Asthma   . History of chicken pox     childhood  . Hypertension   . History of migraine   . Colon polyp 2010    History   Social History  . Marital Status: Legally Separated    Spouse Name: N/A    Number of Children: N/A  . Years of Education: N/A   Occupational History  . Not on file.   Social History Main Topics  . Smoking status: Former Research scientist (life sciences)  . Smokeless tobacco: Never Used     Comment: Quit 1990 - 1 ppd  . Alcohol Use: Yes  . Drug Use: No  . Sexual Activity: Not on file   Other Topics Concern  . Not on file   Social History Narrative  . No narrative on file    Past Surgical History  Procedure Laterality Date  . Cesarean section  2010    Family History  Problem Relation Age of Onset  . Lung  cancer Father   . Hyperlipidemia Father   . Hypertension Father   . Hyperlipidemia Mother   . Heart disease Paternal Grandmother   . Diabetes Neg Hx   . Heart attack Neg Hx     Allergies  Allergen Reactions  . Tetracyclines & Related     Rash    Current Outpatient Prescriptions on File Prior to Visit  Medication Sig Dispense Refill  . cyclobenzaprine (FLEXERIL) 5 MG tablet Take 1 tablet (5 mg total) by mouth at bedtime as needed for muscle spasms.  15 tablet  0  . hydrochlorothiazide (HYDRODIURIL) 25 MG tablet TAKE ONE TABLET BY MOUTH ONE TIME DAILY  30 tablet  2   No current facility-administered medications on file prior to visit.    BP 124/72  Pulse 77  Temp(Src) 97.7 F (36.5 C) (Oral)  Resp 16  Ht 5' 7.5" (1.715 m)  Wt 207 lb (93.895 kg)  BMI 31.92 kg/m2  SpO2 97%       Objective:   Physical Exam  Constitutional: She is oriented to person, place, and time. She appears well-developed and well-nourished. No  distress.  HENT:  Head: Normocephalic and atraumatic.  Mouth/Throat: No oropharyngeal exudate.  Cardiovascular: Normal rate and regular rhythm.   No murmur heard. Pulmonary/Chest: Effort normal and breath sounds normal. No respiratory distress. She has no wheezes. She has no rales. She exhibits no tenderness.  Normal breast examination bilaterally without masses of LAD  Abdominal: Soft. Bowel sounds are normal. She exhibits no distension and no mass. There is no tenderness. There is no rebound and no guarding.  Musculoskeletal: She exhibits no edema.  Neurological: She is alert and oriented to person, place, and time.  Reflex Scores:      Patellar reflexes are 2+ on the right side and 2+ on the left side. Psychiatric: She has a normal mood and affect. Her behavior is normal. Judgment and thought content normal.          Assessment & Plan:

## 2013-09-09 NOTE — Progress Notes (Signed)
Pre visit review using our clinic review tool, if applicable. No additional management support is needed unless otherwise documented below in the visit note. 

## 2013-09-10 ENCOUNTER — Telehealth: Payer: Self-pay | Admitting: Family

## 2013-09-10 DIAGNOSIS — R319 Hematuria, unspecified: Secondary | ICD-10-CM

## 2013-09-10 DIAGNOSIS — R3129 Other microscopic hematuria: Secondary | ICD-10-CM

## 2013-09-10 LAB — URINALYSIS, MICROSCOPIC ONLY
BACTERIA UA: NONE SEEN
CASTS: NONE SEEN
Crystals: NONE SEEN
Squamous Epithelial / LPF: NONE SEEN

## 2013-09-10 LAB — URINALYSIS, ROUTINE W REFLEX MICROSCOPIC
Bilirubin Urine: NEGATIVE
Glucose, UA: NEGATIVE mg/dL
KETONES UR: NEGATIVE mg/dL
LEUKOCYTES UA: NEGATIVE
NITRITE: NEGATIVE
PH: 7 (ref 5.0–8.0)
PROTEIN: NEGATIVE mg/dL
Specific Gravity, Urine: 1.012 (ref 1.005–1.030)
UROBILINOGEN UA: 0.2 mg/dL (ref 0.0–1.0)

## 2013-09-10 MED ORDER — CIPROFLOXACIN HCL 250 MG PO TABS: 250.0000 mg | ORAL_TABLET | Freq: Two times a day (BID) | ORAL | Status: DC

## 2013-09-10 NOTE — Telephone Encounter (Signed)
+   blood in urine.  Recommend rx with cipro x 3 days. Repeat urinalysis in 2 weeks.  Thyroid, liver, blood count, cholesterol, kidney function all look OK.

## 2013-09-11 NOTE — Telephone Encounter (Signed)
Notified pt and she voices understanding. Lab order entered. 

## 2013-09-13 ENCOUNTER — Other Ambulatory Visit: Payer: 59

## 2013-10-08 ENCOUNTER — Ambulatory Visit: Payer: 59 | Admitting: Family Medicine

## 2013-10-11 ENCOUNTER — Inpatient Hospital Stay: Admission: RE | Admit: 2013-10-11 | Payer: 59 | Source: Ambulatory Visit

## 2013-10-25 ENCOUNTER — Encounter: Payer: Self-pay | Admitting: Family

## 2013-10-25 ENCOUNTER — Ambulatory Visit (INDEPENDENT_AMBULATORY_CARE_PROVIDER_SITE_OTHER): Payer: 59 | Admitting: Family

## 2013-10-25 VITALS — BP 140/94 | HR 66 | Temp 98.0°F | Resp 16 | Ht 67.5 in | Wt 209.1 lb

## 2013-10-25 DIAGNOSIS — F32A Depression, unspecified: Secondary | ICD-10-CM

## 2013-10-25 DIAGNOSIS — F329 Major depressive disorder, single episode, unspecified: Secondary | ICD-10-CM

## 2013-10-25 DIAGNOSIS — I10 Essential (primary) hypertension: Secondary | ICD-10-CM

## 2013-10-25 DIAGNOSIS — F3289 Other specified depressive episodes: Secondary | ICD-10-CM

## 2013-10-25 MED ORDER — AMLODIPINE BESYLATE 5 MG PO TABS: 5.0000 mg | ORAL_TABLET | Freq: Every day | ORAL | Status: DC

## 2013-10-25 MED ORDER — VENLAFAXINE HCL ER 75 MG PO CP24: 75.0000 mg | ORAL_CAPSULE | Freq: Every day | ORAL | Status: DC

## 2013-10-25 NOTE — Progress Notes (Signed)
   Subjective:    Patient ID: Wendy Chambers, female    DOB: Jun 17, 1956, 58 y.o.   MRN: [id number]  HPI  Wendy Chambers is a 58 yr old female who presents today with chief complaint of depression.  Last visit she was started on effexor and was having issues with insomnia.   Reports that she is sleeping much better.   Very rarely waking up now during the night.   Feels more motivated. Has been finally "getting things done- like cleaning my house."   This is a "big improvement." Reports some trouble remembering the HS dose of effexor.    BP Readings from Last 3 Encounters:  10/25/13 140/94  09/09/13 124/72  08/28/13 132/90      Review of Systems See HPI  Past Medical History  Diagnosis Date  . Asthma   . History of chicken pox     childhood  . Hypertension   . History of migraine   . Colon polyp 2010    History   Social History  . Marital Status: Legally Separated    Spouse Name: N/A    Number of Children: N/A  . Years of Education: N/A   Occupational History  . Not on file.   Social History Main Topics  . Smoking status: Former Research scientist (life sciences)  . Smokeless tobacco: Never Used     Comment: Quit 1990 - 1 ppd  . Alcohol Use: Yes  . Drug Use: No  . Sexual Activity: Not on file   Other Topics Concern  . Not on file   Social History Narrative  . No narrative on file    Past Surgical History  Procedure Laterality Date  . Cesarean section  2010    Family History  Problem Relation Age of Onset  . Lung cancer Father   . Hyperlipidemia Father   . Hypertension Father   . Hyperlipidemia Mother   . Heart disease Paternal Grandmother   . Diabetes Neg Hx   . Heart attack Neg Hx     Allergies  Allergen Reactions  . Tetracyclines & Related     Rash    Current Outpatient Prescriptions on File Prior to Visit  Medication Sig Dispense Refill  . hydrochlorothiazide (HYDRODIURIL) 25 MG tablet TAKE ONE TABLET BY MOUTH ONE TIME DAILY  30 tablet  2  . venlafaxine  (EFFEXOR) 37.5 MG tablet One tablet by mouth once daily for 3 days, then increase to twice daily  60 tablet  0   No current facility-administered medications on file prior to visit.    BP 140/94  Pulse 66  Temp(Src) 98 F (36.7 C) (Oral)  Resp 16  Ht 5' 7.5" (1.715 m)  Wt 209 lb 1.9 oz (94.856 kg)  BMI 32.25 kg/m2  SpO2 99%       Objective:   Physical Exam        Assessment & Plan:

## 2013-10-25 NOTE — Progress Notes (Signed)
Pre visit review using our clinic review tool, if applicable. No additional management support is needed unless otherwise documented below in the visit note.,h  

## 2013-10-25 NOTE — Patient Instructions (Signed)
Change twice daily effexor to once daily extended release. Start amlodipine for your blood pressure. Follow up in 1 month.

## 2013-10-25 NOTE — Assessment & Plan Note (Signed)
Improved on effexor. Will change to extended release if insurance will cover for ease of administration.

## 2013-10-25 NOTE — Assessment & Plan Note (Signed)
Follow up BP is 150/98 in the office today.  Could be exacerbated by effexor.  She is feeling well on effexor.  Will add amlodipine 5mg  once daily.  Follow up in 1 month.

## 2013-10-28 ENCOUNTER — Telehealth: Payer: Self-pay | Admitting: Family

## 2013-10-28 NOTE — Telephone Encounter (Signed)
Relevant patient education assigned to patient using Emmi. ° °

## 2013-11-29 ENCOUNTER — Encounter: Payer: Self-pay | Admitting: Family

## 2013-11-29 ENCOUNTER — Ambulatory Visit (INDEPENDENT_AMBULATORY_CARE_PROVIDER_SITE_OTHER): Payer: 59 | Admitting: Family

## 2013-11-29 VITALS — BP 122/82 | HR 73 | Temp 98.4°F | Resp 16 | Ht 67.5 in | Wt 211.0 lb

## 2013-11-29 DIAGNOSIS — I10 Essential (primary) hypertension: Secondary | ICD-10-CM

## 2013-11-29 DIAGNOSIS — F3289 Other specified depressive episodes: Secondary | ICD-10-CM

## 2013-11-29 DIAGNOSIS — F329 Major depressive disorder, single episode, unspecified: Secondary | ICD-10-CM

## 2013-11-29 DIAGNOSIS — F32A Depression, unspecified: Secondary | ICD-10-CM

## 2013-11-29 MED ORDER — AMLODIPINE BESYLATE 5 MG PO TABS: 5.0000 mg | ORAL_TABLET | Freq: Every day | ORAL | Status: DC

## 2013-11-29 NOTE — Assessment & Plan Note (Signed)
Stable, continue effexor.  

## 2013-11-29 NOTE — Patient Instructions (Signed)
Please schedule a follow up appointment in 3 months.

## 2013-11-29 NOTE — Progress Notes (Signed)
Pre visit review using our clinic review tool, if applicable. No additional management support is needed unless otherwise documented below in the visit note. 

## 2013-11-29 NOTE — Assessment & Plan Note (Signed)
Improved, continue amlodipine.

## 2013-11-29 NOTE — Progress Notes (Signed)
   Subjective:    Patient ID: Wendy Chambers, female    DOB: [birth year], 58 y.o.   MRN: [id number]  HPI  Wendy Chambers is a 58 yr old female who presents today for follow up.  1) Depression-  Now on extended release effexor. Reports that depression is improved.    2) HTN-  Last visit amlodipine was added to her regimen.  Denies CP/SOB swelling.    BP Readings from Last 3 Encounters:  11/29/13 122/82  10/25/13 140/94  09/09/13 124/72    Review of Systems See HPI  Past Medical History  Diagnosis Date  . Asthma   . History of chicken pox     childhood  . Hypertension   . History of migraine   . Colon polyp 2010    History   Social History  . Marital Status: Legally Separated    Spouse Name: N/A    Number of Children: N/A  . Years of Education: N/A   Occupational History  . Not on file.   Social History Main Topics  . Smoking status: Former Research scientist (life sciences)  . Smokeless tobacco: Never Used     Comment: Quit 1990 - 1 ppd  . Alcohol Use: Yes  . Drug Use: No  . Sexual Activity: Not on file   Other Topics Concern  . Not on file   Social History Narrative  . No narrative on file    Past Surgical History  Procedure Laterality Date  . Cesarean section  2010    Family History  Problem Relation Age of Onset  . Lung cancer Father   . Hyperlipidemia Father   . Hypertension Father   . Hyperlipidemia Mother   . Heart disease Paternal Grandmother   . Diabetes Neg Hx   . Heart attack Neg Hx     Allergies  Allergen Reactions  . Tetracyclines & Related     Rash    Current Outpatient Prescriptions on File Prior to Visit  Medication Sig Dispense Refill  . amLODipine (NORVASC) 5 MG tablet Take 1 tablet (5 mg total) by mouth daily.  30 tablet  1  . hydrochlorothiazide (HYDRODIURIL) 25 MG tablet TAKE ONE TABLET BY MOUTH ONE TIME DAILY  30 tablet  2  . meloxicam (MOBIC) 7.5 MG tablet Take 7.5 mg by mouth as needed.      . venlafaxine XR (EFFEXOR-XR) 75 MG 24 hr  capsule Take 1 capsule (75 mg total) by mouth daily with breakfast.  30 capsule  2   No current facility-administered medications on file prior to visit.    BP 122/82  Pulse 73  Temp(Src) 98.4 F (36.9 C) (Oral)  Resp 16  Ht 5' 7.5" (1.715 m)  Wt 211 lb (95.709 kg)  BMI 32.54 kg/m2  SpO2 97%       Objective:   Physical Exam  Constitutional: She is oriented to person, place, and time. She appears well-developed and well-nourished. No distress.  Cardiovascular: Normal rate and regular rhythm.   No murmur heard. Pulmonary/Chest: Effort normal and breath sounds normal. No respiratory distress. She has no wheezes.  Neurological: She is alert and oriented to person, place, and time.  Psychiatric: She has a normal mood and affect. Her behavior is normal. Judgment and thought content normal.          Assessment & Plan:

## 2014-02-05 ENCOUNTER — Encounter: Payer: Self-pay | Admitting: Physician Assistant

## 2014-02-05 ENCOUNTER — Ambulatory Visit (INDEPENDENT_AMBULATORY_CARE_PROVIDER_SITE_OTHER): Payer: 59 | Admitting: Physician Assistant

## 2014-02-05 ENCOUNTER — Ambulatory Visit (HOSPITAL_BASED_OUTPATIENT_CLINIC_OR_DEPARTMENT_OTHER)
Admission: RE | Admit: 2014-02-05 | Discharge: 2014-02-05 | Disposition: A | Discharge status: Home / Self Care | Payer: 59 | Source: Ambulatory Visit | Attending: Physician Assistant | Admitting: Physician Assistant

## 2014-02-05 VITALS — BP 146/94 | HR 80 | Temp 98.6°F | Resp 16 | Ht 67.5 in | Wt 215.8 lb

## 2014-02-05 DIAGNOSIS — M545 Low back pain, unspecified: Secondary | ICD-10-CM | POA: Insufficient documentation

## 2014-02-05 DIAGNOSIS — M5442 Lumbago with sciatica, left side: Secondary | ICD-10-CM

## 2014-02-05 DIAGNOSIS — M5440 Lumbago with sciatica, unspecified side: Secondary | ICD-10-CM

## 2014-02-05 MED ORDER — TRAMADOL HCL 50 MG PO TABS: 50.0000 mg | ORAL_TABLET | Freq: Four times a day (QID) | ORAL | Status: DC | PRN

## 2014-02-05 MED ORDER — METHYLPREDNISOLONE (PAK) 4 MG PO TABS: ORAL_TABLET | ORAL | Status: DC

## 2014-02-05 NOTE — Assessment & Plan Note (Signed)
Will obtain X-ray lumbar spine. Rx Medrol dosepack.  Rx Tramadol.  Avoid heavy lifting or overexertion.  Topical Aspercreme.  Call or RTC if symptoms are not improving.

## 2014-02-05 NOTE — Patient Instructions (Signed)
Please go downstairs for imaging.  I will call you with your results.  Take medications as directed.  Avoid heavy lifting.  Apply Aspercreme or Icy Hot to lower back.  Once we get over this episode, I recommend physical therapy to help strengthen your core muscles.  Weight loss may also give some benefit.  If symptoms are not improving then we will need additional imaging and to set you up with a specialist.

## 2014-02-05 NOTE — Progress Notes (Signed)
Pre visit review using our clinic review tool, if applicable. No additional management support is needed unless otherwise documented below in the visit note/SLS  

## 2014-02-05 NOTE — Progress Notes (Signed)
Patient presents to clinic today c/o low back pain with left sided radiation x 1 month.  Denies trauma or injury.  Has history of low back pain.  Denies ever having imaging.  Endorses shooting pain down to ankles.  Denies numbness or weakness.  Denies saddle anesthesia or change to bowel or bladder habits.   Past Medical History  Diagnosis Date  . Asthma   . History of chicken pox     childhood  . Hypertension   . History of migraine   . Colon polyp 2010    Current Outpatient Prescriptions on File Prior to Visit  Medication Sig Dispense Refill  . hydrochlorothiazide (HYDRODIURIL) 25 MG tablet TAKE ONE TABLET BY MOUTH ONE TIME DAILY  30 tablet  2  . meloxicam (MOBIC) 7.5 MG tablet Take 7.5 mg by mouth as needed.       No current facility-administered medications on file prior to visit.    Allergies  Allergen Reactions  . Tetracyclines & Related     Rash    Family History  Problem Relation Age of Onset  . Lung cancer Father   . Hyperlipidemia Father   . Hypertension Father   . Hyperlipidemia Mother   . Heart disease Paternal Grandmother   . Diabetes Neg Hx   . Heart attack Neg Hx     History   Social History  . Marital Status: Legally Separated    Spouse Name: N/A    Number of Children: N/A  . Years of Education: N/A   Social History Main Topics  . Smoking status: Former Research scientist (life sciences)  . Smokeless tobacco: Never Used     Comment: Quit 1990 - 1 ppd  . Alcohol Use: Yes  . Drug Use: No  . Sexual Activity: None   Other Topics Concern  . None   Social History Narrative  . None   Review of Systems - See HPI.  All other ROS are negative.  BP 146/94  Pulse 80  Temp(Src) 98.6 F (37 C) (Oral)  Resp 16  Ht 5' 7.5" (1.715 m)  Wt 215 lb 12 oz (97.864 kg)  BMI 33.27 kg/m2  SpO2 98%  Physical Exam  Vitals reviewed. Constitutional: She is oriented to person, place, and time and well-developed, well-nourished, and in no distress.  HENT:  Head: Normocephalic and  atraumatic.  Cardiovascular: Normal rate, regular rhythm, normal heart sounds and intact distal pulses.   Pulmonary/Chest: Effort normal and breath sounds normal. No respiratory distress. She has no wheezes. She has no rales. She exhibits no tenderness.  Neurological: She is alert and oriented to person, place, and time.  Skin: Skin is warm and dry. No rash noted.  Psychiatric: Affect normal.   Assessment/Plan: Low back pain with radiation Will obtain X-ray lumbar spine. Rx Medrol dosepack.  Rx Tramadol.  Avoid heavy lifting or overexertion.  Topical Aspercreme.  Call or RTC if symptoms are not improving.

## 2014-02-13 ENCOUNTER — Ambulatory Visit (HOSPITAL_BASED_OUTPATIENT_CLINIC_OR_DEPARTMENT_OTHER)
Admission: RE | Admit: 2014-02-13 | Discharge: 2014-02-13 | Disposition: A | Discharge status: Home / Self Care | Payer: 59 | Source: Ambulatory Visit | Attending: Physician Assistant | Admitting: Physician Assistant

## 2014-02-13 ENCOUNTER — Encounter: Payer: Self-pay | Admitting: Physician Assistant

## 2014-02-13 ENCOUNTER — Ambulatory Visit (INDEPENDENT_AMBULATORY_CARE_PROVIDER_SITE_OTHER): Payer: 59 | Admitting: Physician Assistant

## 2014-02-13 VITALS — BP 130/96 | HR 73 | Temp 98.3°F | Resp 16 | Ht 67.5 in | Wt 218.0 lb

## 2014-02-13 DIAGNOSIS — M545 Low back pain, unspecified: Secondary | ICD-10-CM

## 2014-02-13 DIAGNOSIS — M25559 Pain in unspecified hip: Secondary | ICD-10-CM

## 2014-02-13 DIAGNOSIS — M25552 Pain in left hip: Secondary | ICD-10-CM

## 2014-02-13 DIAGNOSIS — M5442 Lumbago with sciatica, left side: Secondary | ICD-10-CM

## 2014-02-13 MED ORDER — HYDROCODONE-ACETAMINOPHEN 10-325 MG PO TABS: 1.0000 | ORAL_TABLET | Freq: Four times a day (QID) | ORAL | Status: DC | PRN

## 2014-02-13 NOTE — Progress Notes (Signed)
Patient presents to clinic today c/o left sided low back and hip pain despite treatment with Medrol one week ago. Was also given printed Rx for Tramadol but did not get filled because she thought the written Rx was just a copy of a medication sent to the pharmacy.  Pain is still mostly in left hip.  X-ray lumbar spine obtained without significant abnormality.  No new symptoms.  Past Medical History  Diagnosis Date  . Asthma   . History of chicken pox     childhood  . Hypertension   . History of migraine   . Colon polyp 2010    Current Outpatient Prescriptions on File Prior to Visit  Medication Sig Dispense Refill  . hydrochlorothiazide (HYDRODIURIL) 25 MG tablet TAKE ONE TABLET BY MOUTH ONE TIME DAILY  30 tablet  2  . ibuprofen (ADVIL,MOTRIN) 200 MG tablet Take 200 mg by mouth every 6 (six) hours as needed.       No current facility-administered medications on file prior to visit.    Allergies  Allergen Reactions  . Tetracyclines & Related     Rash    Family History  Problem Relation Age of Onset  . Lung cancer Father   . Hyperlipidemia Father   . Hypertension Father   . Hyperlipidemia Mother   . Heart disease Paternal Grandmother   . Diabetes Neg Hx   . Heart attack Neg Hx     History   Social History  . Marital Status: Legally Separated    Spouse Name: N/A    Number of Children: N/A  . Years of Education: N/A   Social History Main Topics  . Smoking status: Former Research scientist (life sciences)  . Smokeless tobacco: Never Used     Comment: Quit 1990 - 1 ppd  . Alcohol Use: Yes  . Drug Use: No  . Sexual Activity: None   Other Topics Concern  . None   Social History Narrative  . None    Review of Systems - See HPI.  All other ROS are negative.  BP 130/96  Pulse 73  Temp(Src) 98.3 F (36.8 C) (Oral)  Resp 16  Ht 5' 7.5" (1.715 m)  Wt 218 lb (98.884 kg)  BMI 33.62 kg/m2  SpO2 98%  Physical Exam  Vitals reviewed. Constitutional: She is oriented to person, place, and time  and well-developed, well-nourished, and in no distress.  HENT:  Head: Normocephalic and atraumatic.  Cardiovascular: Normal rate, regular rhythm, normal heart sounds and intact distal pulses.   Pulmonary/Chest: Effort normal and breath sounds normal. No respiratory distress. She has no wheezes. She has no rales. She exhibits no tenderness.  Musculoskeletal:       Left hip: She exhibits tenderness. She exhibits normal range of motion, normal strength and no bony tenderness.       Lumbar back: She exhibits tenderness and pain. She exhibits no bony tenderness.  Neurological: She is alert and oriented to person, place, and time. She has normal reflexes.  Skin: Skin is warm and dry. No rash noted.  Psychiatric: Affect normal.   Assessment/Plan: Low back pain with radiation Rx Norco.  Continue supportive measures discussed at last visit.  Will obtain x-ray of left hip.  Referral placed to Sports Medicine for further evaluation.

## 2014-02-13 NOTE — Progress Notes (Signed)
Pre visit review using our clinic review tool, if applicable. No additional management support is needed unless otherwise documented below in the visit note/SLS  

## 2014-02-13 NOTE — Patient Instructions (Signed)
Please go downstairs for imaging.  I will call you with your results. Use Norco as directed for pain.  Avoid heavy lifting.  Continue other measures discussed at last visit.  You will be contacted by Dr. Barbaraann Barthel for further evaluation.

## 2014-02-13 NOTE — Assessment & Plan Note (Signed)
Rx Norco.  Continue supportive measures discussed at last visit.  Will obtain x-ray of left hip.  Referral placed to Sports Medicine for further evaluation.

## 2014-02-17 ENCOUNTER — Ambulatory Visit: Payer: 59 | Admitting: Family Medicine

## 2014-02-19 ENCOUNTER — Ambulatory Visit (INDEPENDENT_AMBULATORY_CARE_PROVIDER_SITE_OTHER): Payer: 59 | Admitting: Family Medicine

## 2014-02-19 ENCOUNTER — Ambulatory Visit: Payer: 59 | Admitting: Family Medicine

## 2014-02-19 ENCOUNTER — Encounter: Payer: Self-pay | Admitting: Family Medicine

## 2014-02-19 VITALS — BP 156/100 | HR 78 | Ht 67.0 in | Wt 215.0 lb

## 2014-02-19 DIAGNOSIS — G5700 Lesion of sciatic nerve, unspecified lower limb: Secondary | ICD-10-CM

## 2014-02-19 DIAGNOSIS — G5702 Lesion of sciatic nerve, left lower limb: Secondary | ICD-10-CM

## 2014-02-19 NOTE — Patient Instructions (Signed)
You have Piriformis syndrome Try to avoid painful activities Tennis ball to massage area when sitting Start physical therapy and do home exercises on days you don't go to therapy for next 6 weeks. Tylenol, motrin/aleve as needed for pain. Heat as needed 15 minutes at a time for spasms. Follow up with me in 6 weeks

## 2014-02-20 ENCOUNTER — Encounter: Payer: Self-pay | Admitting: Family Medicine

## 2014-02-20 DIAGNOSIS — G5700 Lesion of sciatic nerve, unspecified lower limb: Secondary | ICD-10-CM | POA: Insufficient documentation

## 2014-02-20 NOTE — Assessment & Plan Note (Signed)
start physical therapy and home exercises.  Tylenol, nsaids as needed.  Tennis ball massage.  F/u in 6 weeks.

## 2014-02-20 NOTE — Progress Notes (Signed)
Patient ID: Wendy Chambers, female   DOB: [birth year], 58 y.o.   MRN: [id number]  PCP: Nance Pear., NP  Subjective:   HPI: Patient is a 58 y.o. female here for left hip pain.  Patient reports she's had problems with her left hip for about 20 years. Would last for a few weeks at times then go away completely. Current pain about 1 month. Feels primarily in left buttock then down leg. Sharp shooting pain at times. + night pain. No bowel/bladder dysfunction. No change with prednisone. No known injury. Taking motrin or aleve with hydrocodone as needed.  Past Medical History  Diagnosis Date  . Asthma   . History of chicken pox     childhood  . Hypertension   . History of migraine   . Colon polyp 2010    Current Outpatient Prescriptions on File Prior to Visit  Medication Sig Dispense Refill  . hydrochlorothiazide (HYDRODIURIL) 25 MG tablet TAKE ONE TABLET BY MOUTH ONE TIME DAILY  30 tablet  2  . HYDROcodone-acetaminophen (NORCO) 10-325 MG per tablet Take 1 tablet by mouth every 6 (six) hours as needed.  120 tablet  0  . ibuprofen (ADVIL,MOTRIN) 200 MG tablet Take 200 mg by mouth every 6 (six) hours as needed.       No current facility-administered medications on file prior to visit.    Past Surgical History  Procedure Laterality Date  . Cesarean section  2010    Allergies  Allergen Reactions  . Tetracyclines & Related     Rash    History   Social History  . Marital Status: Legally Separated    Spouse Name: N/A    Number of Children: N/A  . Years of Education: N/A   Occupational History  . Not on file.   Social History Main Topics  . Smoking status: Former Research scientist (life sciences)  . Smokeless tobacco: Never Used     Comment: Quit 1990 - 1 ppd  . Alcohol Use: Yes  . Drug Use: No  . Sexual Activity: Not on file   Other Topics Concern  . Not on file   Social History Narrative  . No narrative on file    Family History  Problem Relation Age of Onset  .  Lung cancer Father   . Hyperlipidemia Father   . Hypertension Father   . Hyperlipidemia Mother   . Heart disease Paternal Grandmother   . Diabetes Neg Hx   . Heart attack Neg Hx     BP 156/100  Pulse 78  Ht 5\' 7"  (1.702 m)  Wt 215 lb (97.523 kg)  BMI 33.67 kg/m2  Review of Systems: See HPI above.    Objective:  Physical Exam:  Gen: NAD  Left hip/Back: No gross deformity, scoliosis. TTP left buttock over external rotators.  No midline or bony TTP. FROM. Strength LEs 5/5 all muscle groups except hip abduction 4/5 painful.  2+ MSRs in patellar and achilles tendons, equal bilaterally. Negative SLRs. Sensation intact to light touch bilaterally. Negative logroll bilateral hips Negative fabers, positive piriformis on left.    Assessment & Plan:  1. Left hip piriformis syndrome - start physical therapy and home exercises.  Tylenol, nsaids as needed.  Tennis ball massage.  F/u in 6 weeks.

## 2014-03-04 ENCOUNTER — Ambulatory Visit: Payer: 59 | Attending: Family Medicine | Admitting: Physical Therapy

## 2014-03-04 DIAGNOSIS — IMO0001 Reserved for inherently not codable concepts without codable children: Secondary | ICD-10-CM | POA: Diagnosis present

## 2014-03-04 DIAGNOSIS — M25559 Pain in unspecified hip: Secondary | ICD-10-CM | POA: Insufficient documentation

## 2014-03-04 DIAGNOSIS — I10 Essential (primary) hypertension: Secondary | ICD-10-CM | POA: Insufficient documentation

## 2014-03-11 ENCOUNTER — Ambulatory Visit: Payer: 59 | Admitting: Family

## 2014-03-12 ENCOUNTER — Ambulatory Visit: Payer: 59 | Admitting: Physical Therapy

## 2014-04-03 ENCOUNTER — Ambulatory Visit: Payer: 59 | Admitting: Family Medicine

## 2014-05-19 ENCOUNTER — Other Ambulatory Visit: Payer: Self-pay | Admitting: Family

## 2014-05-19 NOTE — Telephone Encounter (Signed)
Pt was due for follow up of blood pressure in 02/2014 and is past due.  30 day supply of amlodipine sent to pharmacy. Please call pt to arrange follow up.

## 2014-05-20 NOTE — Telephone Encounter (Signed)
Informed patient of medication refill and she states that she does not want to see Lenna [facility] anymore, that she wants to see [facility]. I informed patient that I would need to inform both providers of this before I can transfer. Patient became very upset that she has to "ask permission". Patient states that she may look for another provider at a different office.

## 2014-08-04 ENCOUNTER — Ambulatory Visit (INDEPENDENT_AMBULATORY_CARE_PROVIDER_SITE_OTHER): Payer: 59 | Admitting: Family

## 2014-08-04 ENCOUNTER — Encounter: Payer: Self-pay | Admitting: Family

## 2014-08-04 VITALS — BP 140/90 | HR 81 | Temp 98.2°F | Resp 16

## 2014-08-04 DIAGNOSIS — F418 Other specified anxiety disorders: Secondary | ICD-10-CM

## 2014-08-04 DIAGNOSIS — F32A Depression, unspecified: Secondary | ICD-10-CM

## 2014-08-04 DIAGNOSIS — F329 Major depressive disorder, single episode, unspecified: Secondary | ICD-10-CM

## 2014-08-04 DIAGNOSIS — I10 Essential (primary) hypertension: Secondary | ICD-10-CM

## 2014-08-04 MED ORDER — AMLODIPINE BESYLATE 5 MG PO TABS: 5.0000 mg | ORAL_TABLET | Freq: Every day | ORAL | Status: DC

## 2014-08-04 MED ORDER — HYDROCHLOROTHIAZIDE 25 MG PO TABS: 25.0000 mg | ORAL_TABLET | Freq: Every day | ORAL | Status: DC

## 2014-08-04 MED ORDER — CITALOPRAM HYDROBROMIDE 20 MG PO TABS
ORAL_TABLET | ORAL | Status: DC
Start: 2014-08-04 — End: 2014-09-10

## 2014-08-04 NOTE — Progress Notes (Signed)
Pre visit review using our clinic review tool, if applicable. No additional management support is needed unless otherwise documented below in the visit note. 

## 2014-08-04 NOTE — Assessment & Plan Note (Addendum)
Uncontrolled.  Initiate lexapro.  I instructed pt to start 1/2 tablet once daily for 1 week and then increase to a full tablet once daily on week two as tolerated.  We discussed common side effects such as nausea, drowsiness and weight gain.  Also discussed rare but serious side effect of suicide ideation.  She is instructed to discontinue medication go directly to ED if this occurs.  Pt verbalizes understanding.  Plan follow up in 1 month to evaluate progress.

## 2014-08-04 NOTE — Progress Notes (Signed)
   Subjective:    Patient ID: Wendy Chambers, female    DOB: [birth year], 59 y.o.   MRN: [id number]  HPI  Patient here for follow up of her hypertension.  Patient is currently maintained on the following medications for blood pressure: amlodipine, hctz Patient reports that she ran out of meds on Thursday.  Patient denies chest pain, shortness of breath or swelling. Last 3 blood pressure readings in our office are as follows:  BP Readings from Last 3 Encounters:  08/04/14 140/90  02/19/14 156/100  02/13/14 130/96   Reports feeling moody/tired, weight gain (35 pounds in the last year). Reports that she gets more easily flustered, has trouble handling stress. Reports that she crys easily.    + loud snoring which has worsened with weight gain. Denies family hx of sleep apnea.   Past Medical History  Diagnosis Date  . Asthma   . History of chicken pox     childhood  . Hypertension   . History of migraine   . Colon polyp 2010    Review of Systems    see HPI Objective:    Physical Exam  Constitutional: She appears well-developed and well-nourished.  Cardiovascular: Normal rate, regular rhythm and normal heart sounds.   No murmur heard. Pulmonary/Chest: Effort normal and breath sounds normal. No respiratory distress. She has no wheezes.  Psychiatric: She has a normal mood and affect. Her behavior is normal. Judgment and thought content normal.      BP 140/90 mmHg  Pulse 81  Temp(Src) 98.2 F (36.8 C) (Oral)  Resp 16  SpO2 95% Wt Readings from Last 3 Encounters:  02/19/14 215 lb (97.523 kg)  02/13/14 218 lb (98.884 kg)  02/05/14 215 lb 12 oz (97.864 kg)     Lab Results  Component Value Date   WBC 8.1 09/09/2013   HGB 15.3* 09/09/2013   HCT 43.6 09/09/2013   PLT 322 09/09/2013   GLUCOSE 87 09/09/2013   CHOL 182 09/09/2013   TRIG 150* 09/09/2013   HDL 53 09/09/2013   LDLCALC 99 09/09/2013   ALT 14 09/09/2013   AST 13 09/09/2013   NA 138 09/09/2013   K  4.3 09/09/2013   CL 99 09/09/2013   CREATININE 0.84 09/09/2013   BUN 16 09/09/2013   CO2 30 09/09/2013   TSH 2.049 09/09/2013    Dg Hip Complete Left  02/13/2014   CLINICAL DATA:  Left hip pain  EXAM: LEFT HIP - COMPLETE 2+ VIEW  COMPARISON:  None.  FINDINGS: Normal alignment without fracture. No significant arthropathy or degenerative process. Bony pelvis intact. Hips are symmetric. IUD noted in the midline over the sacrum. Normal bowel gas pattern.  IMPRESSION: No acute osseous finding   Electronically Signed   By: Daryll Brod M.D.   On: 02/13/2014 08:31       Assessment & Plan:  We discussed snoring, and importance of weight loss. She scored low on epsworth sleepiness scale so I clinically doubt OSA. Problem List Items Addressed This Visit    None       Nance Pear., NP

## 2014-08-04 NOTE — Patient Instructions (Signed)
Start citalopram 1/2 tab by mouth once daily for 1 week, then increase to a full tab daily on week two.  Please schedule a follow up appointment in 1 month.

## 2014-08-05 LAB — BASIC METABOLIC PANEL
BUN: 21 mg/dL (ref 6–23)
CHLORIDE: 103 meq/L (ref 96–112)
CO2: 28 meq/L (ref 19–32)
Calcium: 9.4 mg/dL (ref 8.4–10.5)
Creatinine, Ser: 0.87 mg/dL (ref 0.40–1.20)
GFR: 71.53 mL/min (ref >60.00)
GLUCOSE: 91 mg/dL (ref 70–99)
POTASSIUM: 4.3 meq/L (ref 3.5–5.1)
Sodium: 139 mEq/L (ref 135–145)

## 2014-08-07 NOTE — Assessment & Plan Note (Signed)
Better this visit.  Continue current meds.

## 2014-08-22 ENCOUNTER — Encounter: Payer: Self-pay | Admitting: Physician Assistant

## 2014-08-22 ENCOUNTER — Ambulatory Visit (INDEPENDENT_AMBULATORY_CARE_PROVIDER_SITE_OTHER): Payer: 59 | Admitting: Physician Assistant

## 2014-08-22 VITALS — BP 119/62 | HR 92 | Temp 101.3°F | Resp 16 | Ht 67.0 in | Wt 220.5 lb

## 2014-08-22 DIAGNOSIS — R6889 Other general symptoms and signs: Secondary | ICD-10-CM

## 2014-08-22 MED ORDER — HYDROCOD POLST-CHLORPHEN POLST 10-8 MG/5ML PO LQCR: 5.0000 mL | Freq: Two times a day (BID) | ORAL | Status: DC | PRN

## 2014-08-22 MED ORDER — ALBUTEROL SULFATE HFA 108 (90 BASE) MCG/ACT IN AERS: 2.0000 | INHALATION_SPRAY | Freq: Four times a day (QID) | RESPIRATORY_TRACT | Status: DC | PRN

## 2014-08-22 MED ORDER — OSELTAMIVIR PHOSPHATE 75 MG PO CAPS: 75.0000 mg | ORAL_CAPSULE | Freq: Two times a day (BID) | ORAL | Status: DC

## 2014-08-22 NOTE — Progress Notes (Signed)
Pre visit review using our clinic review tool, if applicable. No additional management support is needed unless otherwise documented below in the visit note/SLS  

## 2014-08-22 NOTE — Patient Instructions (Signed)
Please take the Tamiflu as directed.  Use the Tussionex and directed for nighttime cough, but stick with Mucinex or Deslym for daytime cough. Please use albuterol inhaler for chest tightness.  Get plenty of rest and drink plenty of fluids.  Alternate tylenol and ibuprofen for fever and headache.

## 2014-08-22 NOTE — Assessment & Plan Note (Signed)
POC flu swab negative.  Giving classic symptomology and known flu exposure, will empirically treat with Tamiflu.  Rx Tussionex for cough. Increase fluids. Other supportive measures discussed.

## 2014-08-22 NOTE — Progress Notes (Signed)
Patient presents to clinic today c/o sudden onset of dry cough, sore throat, rhinorrhea, chills and aches associated with fever > 101.  Patient endorses several co-workers being treated for the flu presently.  Has not had her flu shot this year. Symptoms have worsened since onset yesterday evening.  Past Medical History  Diagnosis Date  . Asthma   . History of chicken pox     childhood  . Hypertension   . History of migraine   . Colon polyp 2010    Current Outpatient Prescriptions on File Prior to Visit  Medication Sig Dispense Refill  . amLODipine (NORVASC) 5 MG tablet Take 1 tablet (5 mg total) by mouth daily. 90 tablet 1  . citalopram (CELEXA) 20 MG tablet 1/2 tab by mouth  Once daily for 1 week, then increase to a full tab once daily on week. 30 tablet 0  . HYDROcodone-acetaminophen (NORCO) 10-325 MG per tablet Take 1 tablet by mouth every 6 (six) hours as needed. 120 tablet 0  . ibuprofen (ADVIL,MOTRIN) 200 MG tablet Take 200 mg by mouth every 6 (six) hours as needed.    . hydrochlorothiazide (HYDRODIURIL) 25 MG tablet Take 1 tablet (25 mg total) by mouth daily. (Patient not taking: Reported on 08/22/2014) 90 tablet 1   No current facility-administered medications on file prior to visit.    Allergies  Allergen Reactions  . Tetracyclines & Related     Rash    Family History  Problem Relation Age of Onset  . Lung cancer Father   . Hyperlipidemia Father   . Hypertension Father   . Hyperlipidemia Mother   . Heart disease Paternal Grandmother   . Diabetes Neg Hx   . Heart attack Neg Hx     History   Social History  . Marital Status: Legally Separated    Spouse Name: N/A  . Number of Children: N/A  . Years of Education: N/A   Social History Main Topics  . Smoking status: Former Research scientist (life sciences)  . Smokeless tobacco: Never Used     Comment: Quit 1990 - 1 ppd  . Alcohol Use: Yes  . Drug Use: No  . Sexual Activity: Not on file   Other Topics Concern  . None   Social  History Narrative   Review of Systems - See HPI.  All other ROS are negative.  BP 119/62 mmHg  Pulse 92  Temp(Src) 101.3 F (38.5 C) (Oral)  Resp 16  Ht 5\' 7"  (1.702 m)  Wt 220 lb 8 oz (100.018 kg)  BMI 34.53 kg/m2  SpO2 96%  Physical Exam  Constitutional: She is oriented to person, place, and time and well-developed, well-nourished, and in no distress.  HENT:  Head: Normocephalic and atraumatic.  Right Ear: Tympanic membrane, external ear and ear canal normal.  Left Ear: Tympanic membrane, external ear and ear canal normal.  Nose: Rhinorrhea present.  Mouth/Throat: Uvula is midline, oropharynx is clear and moist and mucous membranes are normal.  Eyes: Conjunctivae are normal. Pupils are equal, round, and reactive to light.  Neck: Neck supple.  Cardiovascular: Normal rate, regular rhythm, normal heart sounds and intact distal pulses.   Pulmonary/Chest: Effort normal and breath sounds normal. No respiratory distress. She has no wheezes. She has no rales. She exhibits no tenderness.  Lymphadenopathy:    She has no cervical adenopathy.  Neurological: She is alert and oriented to person, place, and time.  Skin: Skin is warm and dry. No rash noted.  Psychiatric: Affect  normal.  Vitals reviewed.   Recent Results (from the past 2160 hour(s))  Basic metabolic panel     Status: None   Collection Time: 08/04/14  5:08 PM  Result Value Ref Range   Sodium 139 135 - 145 mEq/L   Potassium 4.3 3.5 - 5.1 mEq/L   Chloride 103 96 - 112 mEq/L   CO2 28 19 - 32 mEq/L   Glucose, Bld 91 70 - 99 mg/dL   BUN 21 6 - 23 mg/dL   Creatinine, Ser 0.87 0.40 - 1.20 mg/dL   Calcium 9.4 8.4 - 10.5 mg/dL   GFR 71.53 >60.00 mL/min    Assessment/Plan: Flu-like symptoms POC flu swab negative.  Giving classic symptomology and known flu exposure, will empirically treat with Tamiflu.  Rx Tussionex for cough. Increase fluids. Other supportive measures discussed.

## 2014-09-10 ENCOUNTER — Telehealth: Payer: Self-pay | Admitting: Family

## 2014-09-10 MED ORDER — CITALOPRAM HYDROBROMIDE 20 MG PO TABS: 20.0000 mg | ORAL_TABLET | Freq: Every day | ORAL | Status: DC

## 2014-09-10 NOTE — Telephone Encounter (Signed)
Caller name:Zeiss, Helene Kelp Relation to RV:UYEB Call back number:[phone] Pharmacy:[facility] [facility]  Reason for call: pt is needing rx citalopram (CELEXA) 20 MG tablet , pt has appt on Monday, however she is completely out and states she is afraid she may get sick if she stops the meds,

## 2014-09-10 NOTE — Telephone Encounter (Signed)
30 day supply sent to pharmacy. Notified pt.

## 2014-09-15 ENCOUNTER — Ambulatory Visit: Payer: 59 | Admitting: Family

## 2014-09-23 ENCOUNTER — Encounter: Payer: Self-pay | Admitting: Family

## 2014-09-23 ENCOUNTER — Telehealth: Payer: Self-pay | Admitting: Family

## 2014-09-23 NOTE — Telephone Encounter (Signed)
Pt was no show for follow up appt on 09/15/14- letter sent- charge?

## 2014-09-24 NOTE — Telephone Encounter (Signed)
Yes please

## 2014-09-29 ENCOUNTER — Encounter: Payer: Self-pay | Admitting: Family

## 2014-09-29 ENCOUNTER — Ambulatory Visit (INDEPENDENT_AMBULATORY_CARE_PROVIDER_SITE_OTHER): Payer: 59 | Admitting: Family

## 2014-09-29 VITALS — BP 140/86 | HR 62 | Temp 98.6°F | Resp 16 | Ht 67.5 in | Wt 212.6 lb

## 2014-09-29 DIAGNOSIS — F418 Other specified anxiety disorders: Secondary | ICD-10-CM

## 2014-09-29 DIAGNOSIS — I10 Essential (primary) hypertension: Secondary | ICD-10-CM

## 2014-09-29 MED ORDER — CITALOPRAM HYDROBROMIDE 20 MG PO TABS: 20.0000 mg | ORAL_TABLET | Freq: Every day | ORAL | Status: DC

## 2014-09-29 MED ORDER — AMLODIPINE BESYLATE 5 MG PO TABS: 5.0000 mg | ORAL_TABLET | Freq: Every day | ORAL | Status: DC

## 2014-09-29 NOTE — Patient Instructions (Addendum)
Please start hctz in addition to amlodpine.   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 Continue citalopram.

## 2014-09-29 NOTE — Progress Notes (Signed)
Subjective:    Patient ID: Wendy Chambers, female    DOB: [birth year], 59 y.o.   MRN: [id number]  HPI  Wendy Chambers is a 59 yr old female who presents today for follow up.   Anxiety/Depression- last visit citalopram was started. Reports improved mood, no panic attacks.    HTN- Patient is currently maintained on the following medications for blood pressure: only taking amlodipine Patient reports good compliance with blood pressure medications. Patient denies chest pain, shortness of breath or swelling. Last 3 blood pressure readings in our office are as follows:  BP Readings from Last 3 Encounters:  09/29/14 140/86  08/22/14 119/62  08/04/14 140/90       Review of Systems See HPI  Past Medical History  Diagnosis Date  . Asthma   . History of chicken pox     childhood  . Hypertension   . History of migraine   . Colon polyp 2010    History   Social History  . Marital Status: Legally Separated    Spouse Name: N/A  . Number of Children: N/A  . Years of Education: N/A   Occupational History  . Not on file.   Social History Main Topics  . Smoking status: Former Research scientist (life sciences)  . Smokeless tobacco: Never Used     Comment: Quit 1990 - 1 ppd  . Alcohol Use: Yes  . Drug Use: No  . Sexual Activity: Not on file   Other Topics Concern  . Not on file   Social History Narrative    Past Surgical History  Procedure Laterality Date  . Cesarean section  2010    Family History  Problem Relation Age of Onset  . Lung cancer Father   . Hyperlipidemia Father   . Hypertension Father   . Hyperlipidemia Mother   . Heart disease Paternal Grandmother   . Diabetes Neg Hx   . Heart attack Neg Hx     Allergies  Allergen Reactions  . Tetracyclines & Related     Rash    Current Outpatient Prescriptions on File Prior to Visit  Medication Sig Dispense Refill  . amLODipine (NORVASC) 5 MG tablet Take 1 tablet (5 mg total) by mouth daily. 90 tablet 1  . citalopram  (CELEXA) 20 MG tablet Take 1 tablet (20 mg total) by mouth daily. 30 tablet 0  . HYDROcodone-acetaminophen (NORCO) 10-325 MG per tablet Take 1 tablet by mouth every 6 (six) hours as needed. 120 tablet 0  . ibuprofen (ADVIL,MOTRIN) 200 MG tablet Take 200 mg by mouth every 6 (six) hours as needed.    . hydrochlorothiazide (HYDRODIURIL) 25 MG tablet Take 1 tablet (25 mg total) by mouth daily. (Patient not taking: Reported on 08/22/2014) 90 tablet 1   No current facility-administered medications on file prior to visit.    BP 140/86 mmHg  Pulse 62  Temp(Src) 98.6 F (37 C) (Oral)  Resp 16  Ht 5' 7.5" (1.715 m)  Wt 212 lb 9.6 oz (96.435 kg)  BMI 32.79 kg/m2  SpO2 96%       Objective:   Physical Exam  Constitutional: She is oriented to person, place, and time. She appears well-developed and well-nourished.  Cardiovascular: Normal rate, regular rhythm and normal heart sounds.   No murmur heard. Pulmonary/Chest: Effort normal and breath sounds normal. No respiratory distress. She has no wheezes.  Musculoskeletal: She exhibits no edema.  Neurological: She is alert and oriented to person, place, and time.  Psychiatric: She has a  normal mood and affect. Her behavior is normal. Judgment and thought content normal.          Assessment & Plan:

## 2014-09-29 NOTE — Progress Notes (Signed)
Pre visit review using our clinic review tool, if applicable. No additional management support is needed unless otherwise documented below in the visit note. 

## 2014-09-30 NOTE — Assessment & Plan Note (Signed)
Add hctz, continue amlodipine, follow up in 2 weeks for a nurse visit bp check and bmet.

## 2014-09-30 NOTE — Assessment & Plan Note (Signed)
Improved on citalopram, continue same.

## 2014-11-09 ENCOUNTER — Telehealth: Payer: Self-pay | Admitting: Family

## 2014-11-09 NOTE — Telephone Encounter (Signed)
Marj,  Could you please contact pt and arrange dexa scan?  This was ordered back in March.

## 2014-11-11 NOTE — Telephone Encounter (Signed)
LM ON VM to call back to schedule

## 2014-11-11 NOTE — Telephone Encounter (Signed)
[name] - Can you help with this.

## 2014-12-10 ENCOUNTER — Other Ambulatory Visit: Payer: Self-pay | Admitting: Family

## 2015-01-23 ENCOUNTER — Encounter: Payer: Self-pay | Admitting: Physician Assistant

## 2015-01-23 NOTE — Telephone Encounter (Signed)
Please call Patient in regards to appointment -- see [place].

## 2015-02-16 ENCOUNTER — Other Ambulatory Visit: Payer: Self-pay | Admitting: Family

## 2015-04-21 ENCOUNTER — Encounter: Payer: Self-pay | Admitting: Family

## 2015-04-23 ENCOUNTER — Telehealth: Payer: Self-pay | Admitting: Behavioral Health

## 2015-04-23 NOTE — Telephone Encounter (Signed)
Unable to reach patient at time of Pre-Visit Call.  Left message for patient to return call when available.    

## 2015-04-24 ENCOUNTER — Ambulatory Visit (INDEPENDENT_AMBULATORY_CARE_PROVIDER_SITE_OTHER): Payer: 59 | Admitting: Family

## 2015-04-24 ENCOUNTER — Encounter: Payer: Self-pay | Admitting: Family

## 2015-04-24 ENCOUNTER — Other Ambulatory Visit: Payer: Self-pay | Admitting: Family

## 2015-04-24 ENCOUNTER — Ambulatory Visit: Payer: 59 | Admitting: Family

## 2015-04-24 VITALS — BP 120/70 | HR 71 | Temp 98.4°F | Resp 16 | Ht 67.5 in | Wt 218.2 lb

## 2015-04-24 DIAGNOSIS — M25579 Pain in unspecified ankle and joints of unspecified foot: Secondary | ICD-10-CM | POA: Diagnosis not present

## 2015-04-24 DIAGNOSIS — I10 Essential (primary) hypertension: Secondary | ICD-10-CM | POA: Diagnosis not present

## 2015-04-24 DIAGNOSIS — Z0000 Encounter for general adult medical examination without abnormal findings: Secondary | ICD-10-CM

## 2015-04-24 DIAGNOSIS — F418 Other specified anxiety disorders: Secondary | ICD-10-CM | POA: Diagnosis not present

## 2015-04-24 DIAGNOSIS — Z780 Asymptomatic menopausal state: Secondary | ICD-10-CM

## 2015-04-24 LAB — TSH: TSH: 1.6 u[IU]/mL (ref 0.35–4.50)

## 2015-04-24 LAB — CBC WITH DIFFERENTIAL/PLATELET
Basophils Absolute: 0 10*3/uL (ref 0.0–0.1)
Basophils Relative: 0.5 % (ref 0.0–3.0)
Eosinophils Absolute: 0.2 10*3/uL (ref 0.0–0.7)
Eosinophils Relative: 3 % (ref 0.0–5.0)
HEMATOCRIT: 43.1 % (ref 36.0–46.0)
Hemoglobin: 14.9 g/dL (ref 12.0–15.0)
LYMPHS ABS: 2 10*3/uL (ref 0.7–4.0)
Lymphocytes Relative: 28.1 % (ref 12.0–46.0)
MCHC: 34.5 g/dL (ref 30.0–36.0)
MCV: 89.4 fl (ref 78.0–100.0)
Monocytes Absolute: 0.6 10*3/uL (ref 0.1–1.0)
Monocytes Relative: 8.8 % (ref 3.0–12.0)
NEUTROS ABS: 4.2 10*3/uL (ref 1.4–7.7)
NEUTROS PCT: 59.6 % (ref 43.0–77.0)
PLATELETS: 347 10*3/uL (ref 150.0–400.0)
RBC: 4.82 Mil/uL (ref 3.87–5.11)
RDW: 12.7 % (ref 11.5–15.5)
WBC: 7.1 10*3/uL (ref 4.0–10.5)

## 2015-04-24 LAB — URINALYSIS, ROUTINE W REFLEX MICROSCOPIC
Bilirubin Urine: NEGATIVE
HGB URINE DIPSTICK: NEGATIVE
Ketones, ur: NEGATIVE
LEUKOCYTES UA: NEGATIVE
NITRITE: NEGATIVE
Specific Gravity, Urine: 1.01 (ref 1.000–1.030)
TOTAL PROTEIN, URINE-UPE24: NEGATIVE
Urine Glucose: NEGATIVE
Urobilinogen, UA: 0.2 (ref 0.0–1.0)
WBC, UA: NONE SEEN (ref 0–?)
pH: 7 (ref 5.0–8.0)

## 2015-04-24 LAB — HEPATIC FUNCTION PANEL
ALT: 17 U/L (ref 0–35)
AST: 16 U/L (ref 0–37)
Albumin: 4.2 g/dL (ref 3.5–5.2)
Alkaline Phosphatase: 79 U/L (ref 39–117)
Bilirubin, Direct: 0.1 mg/dL (ref 0.0–0.3)
Total Bilirubin: 0.6 mg/dL (ref 0.2–1.2)
Total Protein: 7.2 g/dL (ref 6.0–8.3)

## 2015-04-24 LAB — BASIC METABOLIC PANEL
BUN: 18 mg/dL (ref 6–23)
CHLORIDE: 101 meq/L (ref 96–112)
CO2: 32 mEq/L (ref 19–32)
Calcium: 9.6 mg/dL (ref 8.4–10.5)
Creatinine, Ser: 0.95 mg/dL (ref 0.40–1.20)
GFR: 64.46 mL/min (ref >60.00)
GLUCOSE: 98 mg/dL (ref 70–99)
POTASSIUM: 4.3 meq/L (ref 3.5–5.1)
Sodium: 140 mEq/L (ref 135–145)

## 2015-04-24 LAB — LIPID PANEL
CHOL/HDL RATIO: 4
Cholesterol: 195 mg/dL (ref 0–200)
HDL: 51.3 mg/dL (ref >39.00)
LDL CALC: 117 mg/dL — AB (ref 0–99)
NonHDL: 143.72
TRIGLYCERIDES: 136 mg/dL (ref 0.0–149.0)
VLDL: 27.2 mg/dL (ref 0.0–40.0)

## 2015-04-24 MED ORDER — HYDROCHLOROTHIAZIDE 25 MG PO TABS: 25.0000 mg | ORAL_TABLET | Freq: Every day | ORAL | Status: DC

## 2015-04-24 MED ORDER — CITALOPRAM HYDROBROMIDE 20 MG PO TABS: 20.0000 mg | ORAL_TABLET | Freq: Every day | ORAL | Status: DC

## 2015-04-24 MED ORDER — AMLODIPINE BESYLATE 5 MG PO TABS: 5.0000 mg | ORAL_TABLET | Freq: Every day | ORAL | Status: DC

## 2015-04-24 NOTE — Assessment & Plan Note (Signed)
BP Readings from Last 3 Encounters:  04/24/15 120/70  09/29/14 140/86  08/22/14 119/62   BP stable on current meds.  Continue same.

## 2015-04-24 NOTE — Patient Instructions (Addendum)
Please stop by imaging on the first floor to schedule mammogram and bone density. Complete lab work prior to leaving. Continue to work on [employer], exercise, weight loss.   You will be contacted about your referral to the podiatrist.

## 2015-04-24 NOTE — Assessment & Plan Note (Signed)
Stable on citalopram. Advised pt to let me know if she develops worsening depression during the upcoming winter months.

## 2015-04-24 NOTE — Progress Notes (Signed)
Pre visit review using our clinic review tool, if applicable. No additional management support is needed unless otherwise documented below in the visit note. 

## 2015-04-24 NOTE — Progress Notes (Signed)
Subjective:    Patient ID: Wendy Chambers, female    DOB: Mar 13, 1956, 59 y.o.   MRN: [id number]  HPI   Patient presents today for complete physical.  Immunizations: declines flu shot Diet: reports healthy diet Exercise: some exercise- walks, yard work Colonoscopy: 2014- due for follow up 2019 Dexa:  Bone density- never done, bu willing.   Pap Smear: 4/15- normal. Done by GYN Mammogram: due Dental:  Up to date Vision- due, goes every 2 years  Left foot pain- since July. Reports that at the end of June she thought she broke a toe on the left foot.  Has pain at the base of the middle toe left foot in closed toe shoes.   HTN- pt is maintained on amlodipine, hctz.  BP Readings from Last 3 Encounters:  04/24/15 120/70  09/29/14 140/86  08/22/14 119/62   Anxiety/depression- continues citalopram.  Mood is good.  Harder to get up in the AM when it is dark out.    Review of Systems  Constitutional: Negative for unexpected weight change.  HENT: Negative for hearing loss and rhinorrhea.   Eyes: Negative for visual disturbance.  Respiratory:       Mild cough due to allergies  Cardiovascular: Negative for leg swelling.  Gastrointestinal: Negative for nausea, diarrhea and constipation.  Genitourinary: Negative for dysuria and frequency.  Musculoskeletal: Negative for myalgias and arthralgias.  Skin: Negative for rash.  Neurological: Negative for headaches.  Hematological: Negative for adenopathy.  Psychiatric/Behavioral:       See HPI   Past Medical History  Diagnosis Date  . Asthma   . History of chicken pox     childhood  . Hypertension   . History of migraine   . Colon polyp 2010    Social History   Social History  . Marital Status: Legally Separated    Spouse Name: N/A  . Number of Children: N/A  . Years of Education: N/A   Occupational History  . Not on file.   Social History Main Topics  . Smoking status: Former Research scientist (life sciences)  . Smokeless tobacco: Never Used      Comment: Quit 1990 - 1 ppd  . Alcohol Use: Yes  . Drug Use: No  . Sexual Activity: Not on file   Other Topics Concern  . Not on file   Social History Narrative    Past Surgical History  Procedure Laterality Date  . Cesarean section  2010    Family History  Problem Relation Age of Onset  . Lung cancer Father   . Hyperlipidemia Father   . Hypertension Father   . Hyperlipidemia Mother   . Heart disease Paternal Grandmother   . Diabetes Neg Hx   . Heart attack Neg Hx   . Heart disease Cousin   . Heart disease Cousin     Allergies  Allergen Reactions  . Tetracyclines & Related     Rash    Current Outpatient Prescriptions on File Prior to Visit  Medication Sig Dispense Refill  . ibuprofen (ADVIL,MOTRIN) 200 MG tablet Take 200 mg by mouth every 6 (six) hours as needed.     No current facility-administered medications on file prior to visit.    BP 120/70 mmHg  Pulse 71  Temp(Src) 98.4 F (36.9 C) (Oral)  Resp 16  Ht 5' 7.5" (1.715 m)  Wt 218 lb 3.2 oz (98.975 kg)  BMI 33.65 kg/m2  SpO2 99%        Objective:   Physical  Exam   Physical Exam  Constitutional: She is oriented to person, place, and time. She appears well-developed and well-nourished. No distress.  HENT:  Head: Normocephalic and atraumatic.  Right Ear: Tympanic membrane and ear canal normal.  Left Ear: Tympanic membrane and ear canal normal.  Mouth/Throat: Oropharynx is clear and moist.  Eyes: Pupils are equal, round, and reactive to light. No scleral icterus.  Neck: Normal range of motion. No thyromegaly present.  Cardiovascular: Normal rate and regular rhythm.   No murmur heard. Pulmonary/Chest: Effort normal and breath sounds normal. No respiratory distress. He has no wheezes. She has no rales. She exhibits no tenderness.  Abdominal: Soft. Bowel sounds are normal. He exhibits no distension and no mass. There is no tenderness. There is no rebound and no guarding.  Musculoskeletal: She  exhibits no edema.  Lymphadenopathy:    She has no cervical adenopathy.  Neurological: She is alert and oriented to person, place, and time.  She exhibits normal muscle tone. Coordination normal.  Skin: Skin is warm and dry.  Psychiatric: She has a normal mood and affect. Her behavior is normal. Judgment and thought content normal.  Breasts: Examined lying Right: Without masses, retractions, discharge or axillary adenopathy.  Left: Without masses, retractions, discharge or axillary adenopathy.  Pelvic: deferred         Assessment & Plan:           Assessment & Plan:  Foot pain- New, refer to podiatry for further evaluation.

## 2015-04-24 NOTE — Assessment & Plan Note (Signed)
Refer for dexa, mammo- pap up to date.  Obtain routine labs. Declines flu shot.

## 2015-04-25 LAB — HEPATITIS C ANTIBODY: HCV Ab: NEGATIVE

## 2015-04-26 ENCOUNTER — Encounter: Payer: Self-pay | Admitting: Family

## 2015-04-27 NOTE — Addendum Note (Signed)
Addended by: Debbrah Alar on: 04/27/2015 12:07 PM   Modules accepted: Miquel Dunn

## 2015-04-27 NOTE — Progress Notes (Signed)
EKG tracing is personally reviewed.  EKG notes NSR.  No acute changes.

## 2015-05-05 ENCOUNTER — Ambulatory Visit (HOSPITAL_BASED_OUTPATIENT_CLINIC_OR_DEPARTMENT_OTHER)
Admission: RE | Admit: 2015-05-05 | Discharge: 2015-05-05 | Disposition: A | Discharge status: Home / Self Care | Payer: 59 | Source: Ambulatory Visit | Attending: Family | Admitting: Family

## 2015-05-05 ENCOUNTER — Encounter: Payer: Self-pay | Admitting: Podiatry

## 2015-05-05 ENCOUNTER — Ambulatory Visit (INDEPENDENT_AMBULATORY_CARE_PROVIDER_SITE_OTHER): Payer: 59 | Admitting: Podiatry

## 2015-05-05 VITALS — BP 161/90 | HR 64 | Ht 67.0 in | Wt 216.0 lb

## 2015-05-05 DIAGNOSIS — Z1231 Encounter for screening mammogram for malignant neoplasm of breast: Secondary | ICD-10-CM | POA: Insufficient documentation

## 2015-05-05 DIAGNOSIS — M7742 Metatarsalgia, left foot: Secondary | ICD-10-CM | POA: Insufficient documentation

## 2015-05-05 DIAGNOSIS — M216X9 Other acquired deformities of unspecified foot: Secondary | ICD-10-CM | POA: Insufficient documentation

## 2015-05-05 DIAGNOSIS — M7661 Achilles tendinitis, right leg: Secondary | ICD-10-CM | POA: Diagnosis not present

## 2015-05-05 DIAGNOSIS — Z780 Asymptomatic menopausal state: Secondary | ICD-10-CM | POA: Insufficient documentation

## 2015-05-05 DIAGNOSIS — M7662 Achilles tendinitis, left leg: Secondary | ICD-10-CM | POA: Diagnosis not present

## 2015-05-05 DIAGNOSIS — Z0000 Encounter for general adult medical examination without abnormal findings: Secondary | ICD-10-CM

## 2015-05-05 NOTE — Progress Notes (Signed)
SUBJECTIVE: 59 y.o. year old female presents complaining of pain and stiffness on 3rd digit left foot. Patient relates history of injury last summer, June 2016. The toe bent over and hyperextended. Pain is more with raised heel shoes. Also having pain in back of heels with protruding bumps x 2 months.  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  REVIEW OF SYSTEMS: A comprehensive review of systems was negative except for: chief complaints.  OBJECTIVE: DERMATOLOGIC EXAMINATION: Nails: normal appearing nails bilaterally No abnormal skin lesions noted.  VASCULAR EXAMINATION OF LOWER LIMBS: Pedal pulses: All pedal pulses are palpable with normal pulsation.  No edema or erythema noted.  NEUROLOGIC EXAMINATION OF THE LOWER LIMBS: All epicritic and tactile sensations grossly intact.  MUSCULOSKELETAL EXAMINATION: Positive for ligamentous laxity. Normal ankle joint and rearfoot range of motion available. High arched foot with elevated first ray upon loading of forefoot bilateral. No pain upon dorsiflexion or plantar flexion of the affected 3rd MPJ left. Unable to flex 3rd MPJ voluntary. Positive of palpable mass posterior lateral heel with increased mild temperature.   ASSESSMENT: 1. Achilles tendonitis bilateral.  2. Compensated rearfoot varus bilateral. 3. Metatarsalgia 3rd left with certain shoes, post traumatic. 4. Elevated first ray bilateral.  PLAN: Reviewed findings and available treatment options, change in shoe gear and activity, NSAIA, and custom orthotics. Reviewed the benefit of custom orthotics. Patient will return for orthotics.

## 2015-05-05 NOTE — Patient Instructions (Signed)
Seen for left foot pain. Reviewed findings and need for orthotics.

## 2015-05-06 ENCOUNTER — Encounter: Payer: Self-pay | Admitting: Family

## 2015-05-06 ENCOUNTER — Other Ambulatory Visit: Payer: Self-pay | Admitting: Family

## 2015-05-06 DIAGNOSIS — M8580 Other specified disorders of bone density and structure, unspecified site: Secondary | ICD-10-CM | POA: Insufficient documentation

## 2015-05-06 MED ORDER — CALCIUM CARBONATE-VITAMIN D 600-400 MG-UNIT PO TABS: 1.0000 | ORAL_TABLET | Freq: Two times a day (BID) | ORAL | Status: DC

## 2015-05-07 ENCOUNTER — Ambulatory Visit (HOSPITAL_BASED_OUTPATIENT_CLINIC_OR_DEPARTMENT_OTHER)
Admission: RE | Admit: 2015-05-07 | Discharge: 2015-05-07 | Disposition: A | Discharge status: Home / Self Care | Payer: 59 | Source: Ambulatory Visit | Attending: Family | Admitting: Family

## 2015-05-07 ENCOUNTER — Telehealth: Payer: Self-pay | Admitting: Family

## 2015-05-07 DIAGNOSIS — S92512A Displaced fracture of proximal phalanx of left lesser toe(s), initial encounter for closed fracture: Secondary | ICD-10-CM | POA: Insufficient documentation

## 2015-05-07 DIAGNOSIS — M7732 Calcaneal spur, left foot: Secondary | ICD-10-CM | POA: Insufficient documentation

## 2015-05-07 DIAGNOSIS — M79672 Pain in left foot: Secondary | ICD-10-CM

## 2015-05-07 DIAGNOSIS — X501XXA Overexertion from prolonged static or awkward postures, initial encounter: Secondary | ICD-10-CM | POA: Diagnosis not present

## 2015-05-07 NOTE — Telephone Encounter (Signed)
Requesting to speak with [facility] regarding referral she went to yesterday, podiatry. Could not remember drs name.  Call back @ [phone]

## 2015-05-07 NOTE — Telephone Encounter (Signed)
I am sorry she had a bad experience. I will place a referral for her to meet with another podiatrist and also will place an order for x ray.

## 2015-05-07 NOTE — Telephone Encounter (Signed)
Spoke with pt. She is very unhappy with podiatry visit yesterday. Felt there were some language issues with Provider. States Provider "rolled her foot around and recommended $300 inserts". Pt states no xrays were ordered. Pt either wants [place] to order xray of foot and/or refer her to someone else. She states she wants her foot fixed and doesn't feel that inserts are the solution. Pt reports previous injury to her foot and 3rd toe of left foot does not move. Feels her achilles in both heels are so tight in the mornings upon waking that she can barely walk.  Please advise.

## 2015-05-07 NOTE — Telephone Encounter (Signed)
Notified pt. She voices understanding and plans to do xray this afternoon. Order / referral signed.

## 2015-05-08 NOTE — Telephone Encounter (Signed)
Please let pt know that there is a fracture at the base of the third toe.  Probably not much to do about it at this point, however we will see if we can move up her appointment with [facility].  Delsa Sale, could you see if they can see her in the next week or so please?

## 2015-05-13 ENCOUNTER — Encounter: Payer: Self-pay | Admitting: Family

## 2015-05-13 DIAGNOSIS — M25572 Pain in left ankle and joints of left foot: Secondary | ICD-10-CM

## 2015-05-13 NOTE — Addendum Note (Signed)
Addended by: Debbrah Alar on: 05/13/2015 02:36 PM   Modules accepted: Orders

## 2015-05-18 ENCOUNTER — Ambulatory Visit: Payer: 59 | Admitting: Family Medicine

## 2015-05-18 ENCOUNTER — Telehealth: Payer: Self-pay | Admitting: *Deleted

## 2015-05-18 NOTE — Telephone Encounter (Signed)
-----   Message from Quintin Alto sent at 05/18/2015  3:26 PM EST ----- Regarding: Question Contact: [phone] Patient wants to know if she is suppose to go see 2 doctors about her foot. States she got calls from 2 offices. Plse adv

## 2015-05-18 NOTE — Telephone Encounter (Signed)
Delsa Sale or Marj-- pt recently referred to [facility]. Can you call them and ask them to place a hold on recent podiatry referral?  Thanks!  Per pt email on 05/13/15, PCP stated to proceed with referral to Dr Barbaraann Barthel first and see if he feels podiatry is still needed. Left detailed information on pt's voicemail re: this recommendation.

## 2015-05-19 ENCOUNTER — Encounter: Payer: Self-pay | Admitting: Family Medicine

## 2015-05-19 ENCOUNTER — Ambulatory Visit (INDEPENDENT_AMBULATORY_CARE_PROVIDER_SITE_OTHER): Payer: 59 | Admitting: Family Medicine

## 2015-05-19 VITALS — BP 154/88 | HR 69 | Ht 67.0 in | Wt 215.0 lb

## 2015-05-19 DIAGNOSIS — M79672 Pain in left foot: Secondary | ICD-10-CM

## 2015-05-19 MED ORDER — NITROGLYCERIN 0.2 MG/HR TD PT24: MEDICATED_PATCH | TRANSDERMAL | Status: DC

## 2015-05-19 NOTE — Patient Instructions (Addendum)
I'd continue buddy taping but not use a hard soled shoe or boot for the toe fracture. You have Achilles Tendinopathy Ibuprofen 600mg  three times a day with food OR aleve 2 tabs twice a day with food for pain and inflammation. Calf raises 3 sets of 10 on level ground once a day first. When these are easy, can do them one legged 3 sets of 10. Finally advance to doing them on a step. Ice bucket 10-15 minutes at end of day - can ice 3-4 times a day. Avoid uneven ground, hills as much as possible. Heel lifts in shoes or shoes with a natural heel lift can help the achilles but you're balancing this against the pain from the toe fracture. Nitro patches 1/4th patch over achilles, change daily.   After a week of using this start to use on the other side too. Consider physical therapy, orthotics if not improving as expected. Follow up in 6 weeks.

## 2015-05-19 NOTE — Telephone Encounter (Signed)
Referral on hold

## 2015-05-26 DIAGNOSIS — M79672 Pain in left foot: Secondary | ICD-10-CM | POA: Insufficient documentation

## 2015-05-26 NOTE — Progress Notes (Signed)
PCP: Nance Pear., NP  Subjective:   HPI: Patient is a 59 y.o. female here for left ankle pain.  Patient reports she had an injury in June when she hit a hole in the water and rolled her foot. Believes this is when she may have fractured her 3rd toe. Has had pain in L > R achilles as well over this time. Pain up to 4/10. Worse with walking. Pain is a tightness. No skin changes, fever, other complaints.  Past Medical History  Diagnosis Date  . Asthma   . History of chicken pox     childhood  . Hypertension   . History of migraine   . Colon polyp 2010    Current Outpatient Prescriptions on File Prior to Visit  Medication Sig Dispense Refill  . amLODipine (NORVASC) 5 MG tablet Take 1 tablet (5 mg total) by mouth daily. 90 tablet 1  . Calcium Carbonate-Vitamin D (CALTRATE 600+D) 600-400 MG-UNIT tablet Take 1 tablet by mouth 2 (two) times daily.    . citalopram (CELEXA) 20 MG tablet Take 1 tablet (20 mg total) by mouth daily. 90 tablet 1  . hydrochlorothiazide (HYDRODIURIL) 25 MG tablet Take 1 tablet (25 mg total) by mouth daily. 90 tablet 1  . ibuprofen (ADVIL,MOTRIN) 200 MG tablet Take 200 mg by mouth every 6 (six) hours as needed.    . VENTOLIN HFA 108 (90 BASE) MCG/ACT inhaler Inhale 2 puffs into the lungs every 6 (six) hours as needed.  0   No current facility-administered medications on file prior to visit.    Past Surgical History  Procedure Laterality Date  . Cesarean section  2010    Allergies  Allergen Reactions  . Tetracyclines & Related     Rash    Social History   Social History  . Marital Status: Legally Separated    Spouse Name: N/A  . Number of Children: N/A  . Years of Education: N/A   Occupational History  . Not on file.   Social History Main Topics  . Smoking status: Former Research scientist (life sciences)  . Smokeless tobacco: Never Used     Comment: Quit 1990 - 1 ppd  . Alcohol Use: 0.0 oz/week    0 Standard drinks or equivalent per week  . Drug Use: No   . Sexual Activity: Not on file   Other Topics Concern  . 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    Family History  Problem Relation Age of Onset  . Lung cancer Father   . Hyperlipidemia Father   . Hypertension Father   . Hyperlipidemia Mother   . Heart disease Paternal Grandmother   . Diabetes Neg Hx   . Heart attack Neg Hx   . Heart disease Cousin     CHF  . Heart disease Cousin     CHF    BP 154/88 mmHg  Pulse 69  Ht 5\' 7"  (1.702 m)  Wt 215 lb (97.523 kg)  BMI 33.67 kg/m2  Review of Systems: See HPI above.    Objective:  Physical Exam:  Gen: NAD  Left foot/ankle: No gross deformity, swelling, ecchymoses FROM TTP achilles tendon at insertion, less 3rd digit. Negative ant drawer and talar tilt.   Negative syndesmotic compression. Thompsons test negative. NV intact distally.  Right foot/ankle: FROM without pain.    Assessment & Plan:  1. Left foot/ankle pain - primary issue currently achilles tendinopathy though has a 3rd digit fracture as well.  Difficulty treating both of these at the same time however.  NSAIDs, icing.  Reviewed home exercise program.  She will start nitro patches for the achilles.  Heel lifts.  Cam walker as needed.  F/u in 6 weeks.  Consider PT, orthotics if not improving.

## 2015-05-26 NOTE — Assessment & Plan Note (Signed)
primary issue currently achilles tendinopathy though has a 3rd digit fracture as well.  Difficulty treating both of these at the same time however.  NSAIDs, icing.  Reviewed home exercise program.  She will start nitro patches for the achilles.  Heel lifts.  Cam walker as needed.  F/u in 6 weeks.  Consider PT, orthotics if not improving.

## 2015-08-01 ENCOUNTER — Other Ambulatory Visit: Payer: Self-pay | Admitting: Family

## 2015-08-12 ENCOUNTER — Encounter: Payer: Self-pay | Admitting: Physician Assistant

## 2015-09-11 ENCOUNTER — Other Ambulatory Visit: Payer: Self-pay | Admitting: Physician Assistant

## 2015-09-11 NOTE — Telephone Encounter (Signed)
Rx sent to the pharmacy by e-script.//AB/CMA 

## 2015-10-23 ENCOUNTER — Ambulatory Visit: Payer: 59 | Admitting: Family

## 2015-11-25 ENCOUNTER — Other Ambulatory Visit: Payer: Self-pay | Admitting: Physician Assistant

## 2015-11-25 NOTE — Telephone Encounter (Signed)
Rx denied.  Refill requested to soon.//AB/CMA

## 2015-12-29 ENCOUNTER — Other Ambulatory Visit: Payer: Self-pay | Admitting: Physician Assistant

## 2015-12-30 NOTE — Telephone Encounter (Signed)
Rx request to pharmacy/SLS  

## 2016-02-17 ENCOUNTER — Other Ambulatory Visit: Payer: Self-pay | Admitting: Family

## 2016-03-07 ENCOUNTER — Other Ambulatory Visit: Payer: Self-pay | Admitting: *Deleted

## 2016-03-07 MED ORDER — HYDROCHLOROTHIAZIDE 25 MG PO TABS: 25.0000 mg | ORAL_TABLET | Freq: Every day | ORAL | 0 refills | Status: DC

## 2016-03-07 NOTE — Telephone Encounter (Signed)
Received fax from [facility] requesting HCTZ refills for 90 days. Pt was due for 6 month f/u in April and is past due.  Can only send 30 day supply at this time. Pt needs follow up with [name] soon. Thanks!

## 2016-03-09 NOTE — Telephone Encounter (Signed)
Left voicemail for pt advising of the below. ALSO, informed pt that a f/u is needed for further refills.

## 2016-03-10 NOTE — Telephone Encounter (Signed)
My-chart message sent      KP 

## 2016-04-13 ENCOUNTER — Encounter: Payer: Self-pay | Admitting: Family

## 2016-04-13 ENCOUNTER — Ambulatory Visit (INDEPENDENT_AMBULATORY_CARE_PROVIDER_SITE_OTHER): Payer: 59 | Admitting: Family

## 2016-04-13 VITALS — BP 133/79 | HR 77 | Temp 98.2°F | Resp 16 | Ht 67.0 in | Wt 213.0 lb

## 2016-04-13 DIAGNOSIS — I10 Essential (primary) hypertension: Secondary | ICD-10-CM

## 2016-04-13 DIAGNOSIS — F418 Other specified anxiety disorders: Secondary | ICD-10-CM | POA: Diagnosis not present

## 2016-04-13 LAB — BASIC METABOLIC PANEL
BUN: 15 mg/dL (ref 6–23)
CALCIUM: 9.5 mg/dL (ref 8.4–10.5)
CO2: 29 meq/L (ref 19–32)
CREATININE: 0.81 mg/dL (ref 0.40–1.20)
Chloride: 104 mEq/L (ref 96–112)
GFR: 77.22 mL/min (ref >60.00)
GLUCOSE: 124 mg/dL — AB (ref 70–99)
Potassium: 3.9 mEq/L (ref 3.5–5.1)
Sodium: 140 mEq/L (ref 135–145)

## 2016-04-13 MED ORDER — AMLODIPINE BESYLATE 5 MG PO TABS: 5.0000 mg | ORAL_TABLET | Freq: Every day | ORAL | 1 refills | Status: DC

## 2016-04-13 MED ORDER — HYDROCHLOROTHIAZIDE 25 MG PO TABS: 25.0000 mg | ORAL_TABLET | Freq: Every day | ORAL | 0 refills | Status: DC

## 2016-04-13 NOTE — Progress Notes (Signed)
Subjective:    Patient ID: Wendy Chambers, female    DOB: Jan 14, 1956, 60 y.o.   MRN: [id number]  HPI  Wendy Chambers is a 60 yr old female who presents today for follow up.  1) HTN- She ran out of hctz 1 week ago.   BP Readings from Last 3 Encounters:  04/13/16 133/79  05/19/15 (!) 154/88  05/05/15 (!) 161/90   She is currently maintained on amlodipine and hctz.  Depression/anxiety-  Not taking citalopram x > 1 year.  Reports that she does not wish to restart at this time. Does have hx of worsening depression in the winter time. Denies significant depression symptoms currently.      Review of Systems  Respiratory: Negative for shortness of breath.   Cardiovascular: Negative for chest pain and leg swelling.   Past Medical History:  Diagnosis Date  . Asthma   . Colon polyp 2010  . History of chicken pox    childhood  . History of migraine   . Hypertension      Social History   Social History  . Marital status: Legally Separated    Spouse name: N/A  . Number of children: N/A  . Years of education: N/A   Occupational History  . Not on file.   Social History Main Topics  . Smoking status: Former Research scientist (life sciences)  . Smokeless tobacco: Never Used     Comment: Quit 1990 - 1 ppd  . Alcohol use 0.0 oz/week  . Drug use: No  . Sexual activity: Not on file   Other Topics Concern  . 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    Past Surgical History:  Procedure Laterality Date  . CESAREAN SECTION  2010    Family History  Problem Relation Age of Onset  . Lung cancer Father   . Hyperlipidemia Father   . Hypertension Father   . Hyperlipidemia Mother   . Heart disease Paternal Grandmother   . Diabetes Neg Hx   . Heart attack Neg Hx   . Heart disease Cousin     CHF  . Heart disease Cousin     CHF    Allergies  Allergen Reactions  .  Tetracyclines & Related     Rash    Current Outpatient Prescriptions on File Prior to Visit  Medication Sig Dispense Refill  . Calcium Carbonate-Vitamin D (CALTRATE 600+D) 600-400 MG-UNIT tablet Take 1 tablet by mouth 2 (two) times daily.    Marland Kitchen ibuprofen (ADVIL,MOTRIN) 200 MG tablet Take 200 mg by mouth every 6 (six) hours as needed.    . citalopram (CELEXA) 20 MG tablet Take 1 tablet (20 mg total) by mouth daily. (Patient not taking: Reported on 04/13/2016) 90 tablet 1  . VENTOLIN HFA 108 (90 Base) MCG/ACT inhaler INHALE 2 PUFFS BY MOUTH INTO THE LUNGS EVERY 6 HOURS AS NEEDED FOR WHEEZING OR SHORTNESS OF BREATH (Patient not taking: Reported on 04/13/2016) 18 Inhaler 0   No current facility-administered medications on file prior to visit.     BP 133/79 (BP Location: Right Arm, Patient Position: Sitting, Cuff Size: Normal)   Pulse 77   Temp 98.2 F (36.8 C) (Oral)   Resp 16   Ht 5\' 7"  (1.702 m)   Wt 213 lb (96.6 kg)   SpO2 98% Comment: room air  BMI 33.36 kg/m  Objective:   Physical Exam  Constitutional: She is oriented to person, place, and time. She appears well-developed and well-nourished.  HENT:  Head: Normocephalic and atraumatic.  Cardiovascular: Normal rate, regular rhythm and normal heart sounds.   No murmur heard. Pulmonary/Chest: Effort normal and breath sounds normal. No respiratory distress. She has no wheezes.  Musculoskeletal: She exhibits no edema.  Neurological: She is alert and oriented to person, place, and time.  Psychiatric: She has a normal mood and affect. Her behavior is normal. Judgment and thought content normal.          Assessment & Plan:  Declines flu shot.

## 2016-04-13 NOTE — Patient Instructions (Signed)
Please complete lab work prior to leaving.   

## 2016-04-13 NOTE — Assessment & Plan Note (Signed)
Stable. Resume hctz, continue amlodipine, obtain follow up bmet.

## 2016-04-13 NOTE — Assessment & Plan Note (Signed)
Stable currently. I have advised the patient to let me know if she has any mood changes.

## 2016-04-13 NOTE — Progress Notes (Signed)
Pre visit review using our clinic review tool, if applicable. No additional management support is needed unless otherwise documented below in the visit note. 

## 2016-04-15 ENCOUNTER — Encounter: Payer: Self-pay | Admitting: Family

## 2016-04-20 ENCOUNTER — Encounter: Payer: Self-pay | Admitting: Family

## 2016-04-20 MED ORDER — CITALOPRAM HYDROBROMIDE 20 MG PO TABS: 20.0000 mg | ORAL_TABLET | Freq: Every day | ORAL | 1 refills | Status: DC

## 2016-05-04 ENCOUNTER — Other Ambulatory Visit: Payer: Self-pay | Admitting: Family

## 2016-05-04 NOTE — Telephone Encounter (Signed)
Medication filled to pharmacy as requested.   

## 2016-08-15 ENCOUNTER — Other Ambulatory Visit: Payer: Self-pay | Admitting: Family

## 2016-08-15 NOTE — Telephone Encounter (Signed)
HCTZ refill sent to pharmacy. Pt was due for fasting physical in January and is past due. Please call pt to schedule CPE, thanks!

## 2016-08-18 NOTE — Telephone Encounter (Signed)
Correction, to schedule CPE

## 2016-08-18 NOTE — Telephone Encounter (Signed)
LVM for pt to call back to schedule F/U appt.

## 2016-10-14 ENCOUNTER — Other Ambulatory Visit: Payer: Self-pay | Admitting: Family

## 2016-10-15 ENCOUNTER — Other Ambulatory Visit: Payer: Self-pay | Admitting: Family

## 2016-10-17 NOTE — Telephone Encounter (Signed)
rx sent for 30 day supply. Please ask pt to schedule OV.

## 2016-10-17 NOTE — Telephone Encounter (Signed)
eScribe request from [facility] for refill on Citalopram 20 mg tab Last filled - 04/20/16, #90x1 Last AEX - 04/13/16 Next AEX - 3-Mths [CPE], no future appointment scheduled Please Advise on refills/SLS 04/23

## 2016-10-19 NOTE — Telephone Encounter (Signed)
Called pt, lvm to call back to schedule a ov for further refills.

## 2016-11-19 ENCOUNTER — Other Ambulatory Visit: Payer: Self-pay | Admitting: Family

## 2016-11-22 NOTE — Telephone Encounter (Signed)
Refill requests for amlodipine, hctz and citalopram were denied. Previous messages have been sent advising pt to contact the office to schedule appt. Pt last seen by PCP 03/2016 and advised to f/u in 06/2016 for CPE. Pt is past due. Can give enough to get pt through to appt once she schedules one but will need apt made first. Mychart message sent to pt.

## 2016-12-12 ENCOUNTER — Ambulatory Visit: Payer: 59 | Admitting: Family

## 2016-12-12 DIAGNOSIS — Z0289 Encounter for other administrative examinations: Secondary | ICD-10-CM

## 2016-12-19 ENCOUNTER — Other Ambulatory Visit: Payer: Self-pay | Admitting: Family

## 2016-12-19 NOTE — Telephone Encounter (Signed)
Patient has a scheduled physical for 12/29/16 with PCP

## 2016-12-19 NOTE — Telephone Encounter (Signed)
Requested drug refills are authorized 30-day Only; however, the patient needs further evaluation and/or laboratory testing before further refills are given. Ask her to make an appointment for this/SLS 06/25  Please call patient and have her schedule CPE with Fasting labs that was due in January, prior to future refills per provider. Thanks/SLS 06/25

## 2016-12-29 ENCOUNTER — Telehealth: Payer: Self-pay | Admitting: Obstetrics and Gynecology

## 2016-12-29 ENCOUNTER — Encounter: Payer: Self-pay | Admitting: Family

## 2016-12-29 ENCOUNTER — Telehealth: Payer: Self-pay | Admitting: Family

## 2016-12-29 ENCOUNTER — Ambulatory Visit (INDEPENDENT_AMBULATORY_CARE_PROVIDER_SITE_OTHER): Payer: 59 | Admitting: Family

## 2016-12-29 VITALS — BP 134/72 | HR 74 | Temp 98.2°F | Resp 16 | Ht 67.0 in | Wt 217.0 lb

## 2016-12-29 DIAGNOSIS — R0600 Dyspnea, unspecified: Secondary | ICD-10-CM

## 2016-12-29 DIAGNOSIS — Z0000 Encounter for general adult medical examination without abnormal findings: Secondary | ICD-10-CM

## 2016-12-29 DIAGNOSIS — R0609 Other forms of dyspnea: Secondary | ICD-10-CM

## 2016-12-29 DIAGNOSIS — J45909 Unspecified asthma, uncomplicated: Secondary | ICD-10-CM | POA: Diagnosis not present

## 2016-12-29 DIAGNOSIS — I10 Essential (primary) hypertension: Secondary | ICD-10-CM | POA: Diagnosis not present

## 2016-12-29 DIAGNOSIS — R3129 Other microscopic hematuria: Secondary | ICD-10-CM

## 2016-12-29 LAB — URINALYSIS, ROUTINE W REFLEX MICROSCOPIC
Bilirubin Urine: NEGATIVE
Ketones, ur: NEGATIVE
Leukocytes, UA: NEGATIVE
Nitrite: NEGATIVE
PH: 7 (ref 5.0–8.0)
SPECIFIC GRAVITY, URINE: 1.015 (ref 1.000–1.030)
TOTAL PROTEIN, URINE-UPE24: NEGATIVE
Urine Glucose: NEGATIVE
Urobilinogen, UA: 0.2 (ref 0.0–1.0)
WBC, UA: NONE SEEN (ref 0–?)

## 2016-12-29 LAB — HEPATIC FUNCTION PANEL
ALK PHOS: 70 U/L (ref 39–117)
ALT: 14 U/L (ref 0–35)
AST: 11 U/L (ref 0–37)
Albumin: 4.1 g/dL (ref 3.5–5.2)
BILIRUBIN DIRECT: 0.1 mg/dL (ref 0.0–0.3)
Total Bilirubin: 0.6 mg/dL (ref 0.2–1.2)
Total Protein: 7 g/dL (ref 6.0–8.3)

## 2016-12-29 LAB — BASIC METABOLIC PANEL
BUN: 15 mg/dL (ref 6–23)
CALCIUM: 9.4 mg/dL (ref 8.4–10.5)
CO2: 30 meq/L (ref 19–32)
CREATININE: 0.88 mg/dL (ref 0.40–1.20)
Chloride: 102 mEq/L (ref 96–112)
GFR: 70 mL/min (ref >60.00)
Glucose, Bld: 107 mg/dL — ABNORMAL HIGH (ref 70–99)
Potassium: 4.1 mEq/L (ref 3.5–5.1)
Sodium: 139 mEq/L (ref 135–145)

## 2016-12-29 LAB — CBC WITH DIFFERENTIAL/PLATELET
BASOS ABS: 0.1 10*3/uL (ref 0.0–0.1)
Basophils Relative: 0.6 % (ref 0.0–3.0)
EOS ABS: 0.4 10*3/uL (ref 0.0–0.7)
Eosinophils Relative: 5.5 % — ABNORMAL HIGH (ref 0.0–5.0)
HCT: 43.8 % (ref 36.0–46.0)
Hemoglobin: 15.2 g/dL — ABNORMAL HIGH (ref 12.0–15.0)
LYMPHS ABS: 2.1 10*3/uL (ref 0.7–4.0)
LYMPHS PCT: 25.9 % (ref 12.0–46.0)
MCHC: 34.6 g/dL (ref 30.0–36.0)
MCV: 89.7 fl (ref 78.0–100.0)
Monocytes Absolute: 0.7 10*3/uL (ref 0.1–1.0)
Monocytes Relative: 8.7 % (ref 3.0–12.0)
NEUTROS ABS: 4.7 10*3/uL (ref 1.4–7.7)
NEUTROS PCT: 59.3 % (ref 43.0–77.0)
PLATELETS: 372 10*3/uL (ref 150.0–400.0)
RBC: 4.88 Mil/uL (ref 3.87–5.11)
RDW: 12.8 % (ref 11.5–15.5)
WBC: 7.9 10*3/uL (ref 4.0–10.5)

## 2016-12-29 LAB — LIPID PANEL
CHOL/HDL RATIO: 3
Cholesterol: 185 mg/dL (ref 0–200)
HDL: 54.1 mg/dL (ref >39.00)
LDL CALC: 104 mg/dL — AB (ref 0–99)
NONHDL: 130.8
Triglycerides: 132 mg/dL (ref 0.0–149.0)
VLDL: 26.4 mg/dL (ref 0.0–40.0)

## 2016-12-29 LAB — TSH: TSH: 2 u[IU]/mL (ref 0.35–4.50)

## 2016-12-29 MED ORDER — ZOSTER VAC RECOMB ADJUVANTED 50 MCG/0.5ML IM SUSR: INTRAMUSCULAR | 1 refills | Status: DC

## 2016-12-29 MED ORDER — BECLOMETHASONE DIPROP HFA 40 MCG/ACT IN AERB: 2.0000 | INHALATION_SPRAY | Freq: Two times a day (BID) | RESPIRATORY_TRACT | 5 refills | Status: DC

## 2016-12-29 NOTE — Telephone Encounter (Signed)
I would like to try to obtain those records please and if we are unable to obtain, then I would recommend re-evaluation since it has been so long.

## 2016-12-29 NOTE — Telephone Encounter (Signed)
Please contact patient and let her know that her urine is showing microscopic blood. Has she ever seen a urologist about this? If not, I would recommend that she see urology. They will do further evaluation. Other lab work looks stable.

## 2016-12-29 NOTE — Telephone Encounter (Signed)
Referral placed.

## 2016-12-29 NOTE — Patient Instructions (Signed)
Begin Qvar 2 puffs twice daily. You may use albuterol as needed for wheezing. You will be contacted about your echo and stress test as well as referral to GYN.

## 2016-12-29 NOTE — Telephone Encounter (Signed)
Notified pt and she reports workup completed many years ago and doesn't remember who she saw but states additional testing was negative. Please advise if any further instructions.

## 2016-12-29 NOTE — Telephone Encounter (Signed)
Called and left a message for patient to call back to schedule a new patient doctor referral for AEX. °

## 2016-12-29 NOTE — Telephone Encounter (Signed)
Left message for pt to return my call.

## 2016-12-29 NOTE — Progress Notes (Signed)
Subjective:    Patient ID: Wendy Chambers, female    DOB: 05-19-1956, 61 y.o.   MRN: [id number]  HPI  Patient presents today for complete physical.  Immunizations: tetanus up to date Diet:  Needs some improvement Exercise: reports that she is not exercising Colonoscopy:2014- Q5 years, due next year Dexa: 2016  Pap Smear: ?2016 [place] [facility] Mammogram: 11/16  HTN- has been off of amlodipine for some time. Wants to know if she can remain off.  BP Readings from Last 3 Encounters:  12/29/16 134/72  04/13/16 133/79  05/19/15 (!) 154/88   Wt Readings from Last 3 Encounters:  12/29/16 217 lb (98.4 kg)  04/13/16 213 lb (96.6 kg)  05/19/15 215 lb (97.5 kg)   Asthma- Reports that she has been wheezing more lately. Has tried claritin.  Reports that she has an albuterol inhaler.  Denies chest pain.  Seems to be short of breath when she "walks up hills."       Review of Systems  Constitutional: Negative for unexpected weight change.  HENT: Negative for hearing loss.   Eyes: Negative for visual disturbance.  Respiratory: Positive for cough and wheezing.   Cardiovascular: Negative for palpitations and leg swelling.  Gastrointestinal: Negative for constipation and diarrhea.  Genitourinary: Negative for dysuria and frequency.  Musculoskeletal: Negative for arthralgias and myalgias.  Skin: Negative for rash.  Neurological: Negative for headaches.  Hematological: Negative for adenopathy.  Psychiatric/Behavioral:       Denies depression/anxiety       Past Medical History:  Diagnosis Date  . Asthma   . Colon polyp 2010  . History of chicken pox    childhood  . History of migraine   . Hypertension      Social History   Social History  . Marital status: Legally Separated    Spouse name: N/A  . Number of children: N/A  . Years of education: N/A   Occupational History  . Not on file.   Social History Main Topics  . Smoking status: Former Research scientist (life sciences)  . Smokeless  tobacco: Never Used     Comment: Quit 1990 - 1 ppd  . Alcohol use 0.0 oz/week  . Drug use: No  . Sexual activity: Not on file   Other Topics Concern  . 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    Past Surgical History:  Procedure Laterality Date  . CESAREAN SECTION  2010    Family History  Problem Relation Age of Onset  . Heart disease Cousin        CHF  . Heart disease Cousin        CHF  . Lung cancer Father   . Hyperlipidemia Father   . Hypertension Father   . Hyperlipidemia Mother   . Heart disease Paternal Grandmother   . Diabetes Neg Hx   . Heart attack Neg Hx     Allergies  Allergen Reactions  . Lisinopril     Other reaction(s): Dizziness (intolerance)  . Losartan Potassium     Other reaction(s): Cough (ALLERGY/intolerance)  . Tetracyclines & Related     Rash    Current Outpatient Prescriptions on File Prior to Visit  Medication Sig Dispense Refill  . amLODipine (NORVASC) 5 MG tablet TAKE ONE TABLET BY MOUTH ONE TIME DAILY 30 tablet 0  . Calcium Carbonate-Vitamin D (  CALTRATE 600+D) 600-400 MG-UNIT tablet Take 1 tablet by mouth 2 (two) times daily.    . hydrochlorothiazide (HYDRODIURIL) 25 MG tablet TAKE ONE TABLET BY MOUTH ONE TIME DAILY 30 tablet 0  . ibuprofen (ADVIL,MOTRIN) 200 MG tablet Take 200 mg by mouth every 6 (six) hours as needed.    . VENTOLIN HFA 108 (90 Base) MCG/ACT inhaler INHALE 2 PUFFS BY MOUTH INTO THE LUNGS EVERY 6 HOURS AS NEEDED FOR WHEEZING OR SHORTNESS OF BREATH 18 Inhaler 0   No current facility-administered medications on file prior to visit.     BP 134/72 (BP Location: Right Arm, Cuff Size: Large)   Pulse 74   Temp 98.2 F (36.8 C) (Oral)   Resp 16   Ht 5\' 7"  (1.702 m)   Wt 217 lb (98.4 kg)   SpO2 97%   BMI 33.99 kg/m    Objective:   Physical Exam  Physical Exam  Constitutional: She is  oriented to person, place, and time. She appears well-developed and well-nourished. No distress.  HENT:  Head: Normocephalic and atraumatic.  Right Ear: Tympanic membrane and ear canal normal.  Left Ear: Tympanic membrane and ear canal normal.  Mouth/Throat: Oropharynx is clear and moist.  Eyes: Pupils are equal, round, and reactive to light. No scleral icterus.  Neck: Normal range of motion. No thyromegaly present.  Cardiovascular: Normal rate and regular rhythm.   No murmur heard. Pulmonary/Chest: Effort normal and breath sounds normal. No respiratory distress. He has no wheezes. She has no rales. She exhibits no tenderness.  Abdominal: Soft. Bowel sounds are normal. She exhibits no distension and no mass. There is no tenderness. There is no rebound and no guarding.  Musculoskeletal: She exhibits no edema.  Lymphadenopathy:    She has no cervical adenopathy.  Neurological: She is alert and oriented to person, place, and time. She has normal patellar reflexes. She exhibits normal muscle tone. Coordination normal.  Skin: Skin is warm and dry.  Psychiatric: She has a normal mood and affect. Her behavior is normal. Judgment and thought content normal.  Breasts: Examined lying Right: Without masses, retractions, discharge or axillary adenopathy.  Left: Without masses, retractions, discharge or axillary adenopathy.           Assessment & Plan:         Assessment & Plan:  Preventative Care-Discussed diet, exercise and weight loss. EKG tracing is personally reviewed.  EKG notes NSR.  No acute changes. I gave her an rx for shingrix to bring to a local pharmacy since we do not have it in stock. She would like referral to GYN for Pap.   Asthma/DOE- having DOE and increased wheezing. Will add Qvar.  Will also send for ETT and 2D echo to further evaluate her heart and make sure her symptoms are not cardiac. She does have a family history of heart disease.   HTN- BP looks ok today off of  amlodipine. Advised pt ok to remain off will continue to monitor her bp.

## 2016-12-29 NOTE — Telephone Encounter (Signed)
Notified pt. She wanted to know if she should try an antibiotic before referral to urologist. Advised her that since blood is only abnormal finding and she has no urinary symptoms that PCP would still recommend urology referral. Pt is agreeable. Please advise if any other recommendation?

## 2016-12-30 NOTE — Telephone Encounter (Signed)
Routing referral back to referring office. Patient has declined to schedule an appointment with our office.

## 2016-12-30 NOTE — Telephone Encounter (Signed)
Called and left a message for patient to call back to schedule a new patient doctor referral. °

## 2017-01-03 ENCOUNTER — Ambulatory Visit (HOSPITAL_BASED_OUTPATIENT_CLINIC_OR_DEPARTMENT_OTHER)
Admission: RE | Admit: 2017-01-03 | Discharge: 2017-01-03 | Disposition: A | Discharge status: Home / Self Care | Payer: 59 | Source: Ambulatory Visit | Attending: Family | Admitting: Family

## 2017-01-03 DIAGNOSIS — Z1231 Encounter for screening mammogram for malignant neoplasm of breast: Secondary | ICD-10-CM | POA: Diagnosis not present

## 2017-01-03 DIAGNOSIS — Z0000 Encounter for general adult medical examination without abnormal findings: Secondary | ICD-10-CM

## 2017-01-04 ENCOUNTER — Other Ambulatory Visit: Payer: Self-pay | Admitting: Family

## 2017-01-04 DIAGNOSIS — R928 Other abnormal and inconclusive findings on diagnostic imaging of breast: Secondary | ICD-10-CM

## 2017-01-05 ENCOUNTER — Other Ambulatory Visit (HOSPITAL_COMMUNITY): Payer: 59

## 2017-01-06 ENCOUNTER — Telehealth: Payer: Self-pay | Admitting: *Deleted

## 2017-01-06 NOTE — Telephone Encounter (Signed)
Received Physician Orders from [facility]; forwarded to provider/SLS 07/13 

## 2017-01-12 ENCOUNTER — Other Ambulatory Visit: Payer: 59

## 2017-01-18 ENCOUNTER — Other Ambulatory Visit: Payer: Self-pay | Admitting: Family

## 2017-01-19 ENCOUNTER — Ambulatory Visit (INDEPENDENT_AMBULATORY_CARE_PROVIDER_SITE_OTHER): Payer: 59

## 2017-01-19 ENCOUNTER — Encounter: Payer: Self-pay | Admitting: Family

## 2017-01-19 ENCOUNTER — Ambulatory Visit
Admission: RE | Admit: 2017-01-19 | Discharge: 2017-01-19 | Disposition: A | Discharge status: Home / Self Care | Payer: 59 | Source: Ambulatory Visit | Attending: Family | Admitting: Family

## 2017-01-19 ENCOUNTER — Other Ambulatory Visit: Payer: Self-pay | Admitting: Family

## 2017-01-19 ENCOUNTER — Other Ambulatory Visit: Payer: Self-pay

## 2017-01-19 ENCOUNTER — Ambulatory Visit (HOSPITAL_COMMUNITY): Payer: 59 | Attending: Cardiology

## 2017-01-19 DIAGNOSIS — R0609 Other forms of dyspnea: Secondary | ICD-10-CM | POA: Insufficient documentation

## 2017-01-19 DIAGNOSIS — R0600 Dyspnea, unspecified: Secondary | ICD-10-CM

## 2017-01-19 DIAGNOSIS — R928 Other abnormal and inconclusive findings on diagnostic imaging of breast: Secondary | ICD-10-CM

## 2017-01-19 DIAGNOSIS — N6489 Other specified disorders of breast: Secondary | ICD-10-CM

## 2017-01-19 LAB — EXERCISE TOLERANCE TEST
CSEPED: 6 min
Exercise duration (sec): 0 s
Rest HR: 71 {beats}/min

## 2017-01-23 ENCOUNTER — Telehealth: Payer: Self-pay | Admitting: *Deleted

## 2017-01-23 NOTE — Telephone Encounter (Signed)
Faxed refill request received from [facility] for Citalopram 20 mg tab Faxed Request Denied; Rx Discontinued at 12/29/16 OV, no longer needed/SLS 07/30

## 2017-02-17 ENCOUNTER — Other Ambulatory Visit: Payer: Self-pay | Admitting: Family

## 2017-02-22 ENCOUNTER — Ambulatory Visit: Payer: 59 | Admitting: Family

## 2017-03-01 ENCOUNTER — Ambulatory Visit: Payer: 59 | Admitting: Family

## 2017-03-01 DIAGNOSIS — Z0289 Encounter for other administrative examinations: Secondary | ICD-10-CM

## 2017-03-14 ENCOUNTER — Ambulatory Visit (INDEPENDENT_AMBULATORY_CARE_PROVIDER_SITE_OTHER): Payer: 59 | Admitting: Medical

## 2017-03-14 ENCOUNTER — Encounter: Payer: Self-pay | Admitting: Medical

## 2017-03-14 VITALS — BP 140/70 | HR 88 | Temp 98.0°F | Resp 16 | Ht 67.0 in | Wt 219.8 lb

## 2017-03-14 DIAGNOSIS — L089 Local infection of the skin and subcutaneous tissue, unspecified: Secondary | ICD-10-CM | POA: Diagnosis not present

## 2017-03-14 DIAGNOSIS — W5501XA Bitten by cat, initial encounter: Secondary | ICD-10-CM | POA: Diagnosis not present

## 2017-03-14 MED ORDER — AMOXICILLIN-POT CLAVULANATE 875-125 MG PO TABS: 1.0000 | ORAL_TABLET | Freq: Two times a day (BID) | ORAL | 0 refills | Status: DC

## 2017-03-14 NOTE — Patient Instructions (Signed)
You have a cat bite which appears to have gotten quickly infected. For the infection will prescribe Augmentin antibiotic. Please start that today.  If in the event you have rapid expanding redness up your arm then would advise be seen in the emergency department.  Please schedule appointment for Thursday at 1 PM in the afternoon. If by then the area is no longer red, warm, tender and no discharge then you had the option to cancel that appointment. However since the area has gotten infected very quickly, I do want you to have the option to be rechecked for persistent/severe infection. Any questions regarding area please call or my chart [place].

## 2017-03-14 NOTE — Progress Notes (Signed)
Subjective:    Patient ID: Wendy Chambers, female    DOB: 08/19/1955, 61 y.o.   MRN: [id number]  HPI   Pt in for cat bite that occurred last night. Pt cat is up to date on vaccines. Cat is her cat. She was holding her cat and dog was close to her. Cat got nervous and bit her on left forearm.   Within hours left forearm is red and tender around puncture site.  Bite occurred 9 pm last night approximate      Review of Systems  Constitutional: Negative for chills, fatigue and fever.  Respiratory: Negative for cough, chest tightness, shortness of breath and wheezing.   Cardiovascular: Negative for chest pain and palpitations.  Gastrointestinal: Negative for abdominal pain.  Musculoskeletal: Negative for back pain, myalgias and neck pain.  Skin:       Rt forearm.  Neurological: Negative for dizziness, seizures, syncope, weakness, numbness and headaches.  Hematological: Negative for adenopathy. Does not bruise/bleed easily.  Psychiatric/Behavioral: Negative for behavioral problems and confusion.   Past Medical History:  Diagnosis Date  . Asthma   . Colon polyp 2010  . History of chicken pox    childhood  . History of migraine   . Hypertension      Social History   Social History  . Marital status: Legally Separated    Spouse name: N/A  . Number of children: N/A  . Years of education: N/A   Occupational History  . Not on file.   Social History Main Topics  . Smoking status: Former Research scientist (life sciences)  . Smokeless tobacco: Never Used     Comment: Quit 1990 - 1 ppd  . Alcohol use 0.0 oz/week  . Drug use: No  . Sexual activity: Not on file   Other Topics Concern  . 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    Past Surgical History:  Procedure Laterality Date  . CESAREAN SECTION  2010    Family History  Problem  Relation Age of Onset  . Heart disease Cousin        chf, on father's side, CAD, female  . Heart disease Cousin        chf, on father's side, female  . Lung cancer Father        died at 80  . Hyperlipidemia Father   . Hypertension Father   . Hyperlipidemia Mother   . Heart disease Paternal Grandmother   . Diabetes Neg Hx   . Heart attack Neg Hx     Allergies  Allergen Reactions  . Lisinopril     Other reaction(s): Dizziness (intolerance)  . Losartan Potassium     Other reaction(s): Cough (ALLERGY/intolerance)  . Tetracyclines & Related     Rash    Current Outpatient Prescriptions on File Prior to Visit  Medication Sig Dispense Refill  . amLODipine (NORVASC) 5 MG tablet TAKE ONE TABLET BY MOUTH ONE TIME DAILY 30 tablet 0  . beclomethasone (QVAR REDIHALER) 40 MCG/ACT inhaler Inhale 2 puffs into the lungs 2 (two) times daily. 1 Inhaler 5  . Calcium Carbonate-Vitamin D (CALTRATE 600+D) 600-400 MG-UNIT tablet Take 1 tablet by mouth 2 (two) times daily.    . citalopram (CELEXA) 20 MG tablet TAKE ONE TABLET BY MOUTH ONE TIME DAILY **MUST CALL DR. FOR APPOINTMENT** 30 tablet 0  .  hydrochlorothiazide (HYDRODIURIL) 25 MG tablet TAKE ONE TABLET BY MOUTH ONE TIME DAILY (NO MORE REFILLS UNTIL SEEN BY PHYSICIAN) 30 tablet 5  . ibuprofen (ADVIL,MOTRIN) 200 MG tablet Take 200 mg by mouth every 6 (six) hours as needed.    . VENTOLIN HFA 108 (90 Base) MCG/ACT inhaler INHALE 2 PUFFS BY MOUTH INTO THE LUNGS EVERY 6 HOURS AS NEEDED FOR WHEEZING OR SHORTNESS OF BREATH 18 Inhaler 0  . Zoster Vac Recomb Adjuvanted [facility]) injection Inject 38mcg IM now and again in 2-6 months 0.5 mL 1   No current facility-administered medications on file prior to visit.     BP 140/70   Pulse 88   Temp 98 F (36.7 C) (Oral)   Resp 16   Ht 5\' 7"  (1.702 m)   Wt 219 lb 12.8 oz (99.7 kg)   SpO2 97%   BMI 34.43 kg/m       Objective:   Physical Exam  General- No acute distress. Pleasant patient. Neck-  Full range of motion, no jvd Lungs- Clear, even and unlabored. Heart- regular rate and rhythm. Neurologic- CNII- XII grossly intact.  Rt forearm- mid aspect ventral region. 3 puncture wounds. One moderate large puntcure(scab formed). Area of redness and induration 2 cm x 2 cm approximate. No dc from puncture wounds.        Assessment & Plan:  You have a cat bite which appears to have gotten quickly infected. For the infection will prescribe Augmentin antibiotic. Please start that today.  If in the event you have rapid expanding redness up your arm then would advise be seen in the emergency department.  Please schedule appointment for Thursday at 1 PM in the afternoon. If by then the area is no longer red, warm, tender and no discharge then you had the option to cancel that appointment. However since the area has gotten infected very quickly, I do want you to have the option to be rechecked for persistent/severe infection. Any questions regarding area please call or my chart [place].  Zacharey Jensen, Percell Miller, PA-C

## 2017-03-16 ENCOUNTER — Telehealth: Payer: Self-pay | Admitting: Family

## 2017-03-16 ENCOUNTER — Ambulatory Visit: Payer: 59 | Admitting: Medical

## 2017-03-16 NOTE — Telephone Encounter (Signed)
Pt called in at 12:28 to cancel her apt. She said that she was advised by provider to cancel if she no longer need apt.

## 2017-03-16 NOTE — Telephone Encounter (Signed)
That is correct. So no charge.

## 2017-03-17 ENCOUNTER — Encounter: Payer: Self-pay | Admitting: Medical

## 2017-03-17 ENCOUNTER — Ambulatory Visit (INDEPENDENT_AMBULATORY_CARE_PROVIDER_SITE_OTHER): Payer: 59 | Admitting: Medical

## 2017-03-17 VITALS — BP 138/83 | HR 75 | Temp 98.1°F | Resp 16 | Ht 67.0 in | Wt 222.0 lb

## 2017-03-17 DIAGNOSIS — L089 Local infection of the skin and subcutaneous tissue, unspecified: Secondary | ICD-10-CM | POA: Diagnosis not present

## 2017-03-17 DIAGNOSIS — W5501XA Bitten by cat, initial encounter: Secondary | ICD-10-CM

## 2017-03-17 DIAGNOSIS — L03114 Cellulitis of left upper limb: Secondary | ICD-10-CM

## 2017-03-17 MED ORDER — CLINDAMYCIN HCL 300 MG PO CAPS: 300.0000 mg | ORAL_CAPSULE | Freq: Three times a day (TID) | ORAL | 0 refills | Status: DC

## 2017-03-17 MED ORDER — CEFTRIAXONE SODIUM 1 G IJ SOLR
1.0000 g | Freq: Once | INTRAMUSCULAR | Status: AC
  Administered 2017-03-17: 1 g via INTRAMUSCULAR

## 2017-03-17 MED ORDER — METRONIDAZOLE 500 MG PO TABS: 500.0000 mg | ORAL_TABLET | Freq: Three times a day (TID) | ORAL | 0 refills | Status: DC

## 2017-03-17 NOTE — Progress Notes (Addendum)
Subjective:    Patient ID: Wendy Chambers, female    DOB: [birth year], 61 y.o.   MRN: [id number]  HPI  Pt in for follow up. She had appointment for cat bite follow up for infection on wed. But area looked better and she cancelled. No fever, no chill, and no sweats. Last night some yellow dc from the largest puncture wound.   Pt is on augmentin. On antibiotic since Tuesday.   The prior redness around the wound 2 cm x 2 cm decreased. But at puncture wound site some dc. Worse at largest puncture site.        Review of Systems  Constitutional: Negative for chills, fatigue and fever.  Respiratory: Negative for cough, chest tightness, shortness of breath and wheezing.   Cardiovascular: Negative for chest pain and palpitations.  Gastrointestinal: Negative for abdominal pain.  Musculoskeletal:       See history of present illness  Skin:       See history of present illness  Neurological: Negative for dizziness, facial asymmetry and weakness.  Hematological: Negative for adenopathy. Does not bruise/bleed easily.  Psychiatric/Behavioral: Negative for confusion and decreased concentration.    Past Medical History:  Diagnosis Date  . Asthma   . Colon polyp 2010  . History of chicken pox    childhood  . History of migraine   . Hypertension      Social History   Social History  . Marital status: Legally Separated    Spouse name: N/A  . Number of children: N/A  . Years of education: N/A   Occupational History  . Not on file.   Social History Main Topics  . Smoking status: Former Research scientist (life sciences)  . Smokeless tobacco: Never Used     Comment: Quit 1990 - 1 ppd  . Alcohol use 0.0 oz/week  . Drug use: No  . Sexual activity: Not on file   Other Topics Concern  . 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    Past Surgical  History:  Procedure Laterality Date  . CESAREAN SECTION  2010    Family History  Problem Relation Age of Onset  . Heart disease Cousin        chf, on father's side, CAD, female  . Heart disease Cousin        chf, on father's side, female  . Lung cancer Father        died at 71  . Hyperlipidemia Father   . Hypertension Father   . Hyperlipidemia Mother   . Heart disease Paternal Grandmother   . Diabetes Neg Hx   . Heart attack Neg Hx     Allergies  Allergen Reactions  . Lisinopril     Other reaction(s): Dizziness (intolerance)  . Losartan Potassium     Other reaction(s): Cough (ALLERGY/intolerance)  . Tetracyclines & Related     Rash    Current Outpatient Prescriptions on File Prior to Visit  Medication Sig Dispense Refill  . amLODipine (NORVASC) 5 MG tablet TAKE ONE TABLET BY MOUTH ONE TIME DAILY 30 tablet 0  . beclomethasone (QVAR REDIHALER) 40 MCG/ACT inhaler Inhale 2 puffs into the lungs 2 (two) times daily. 1 Inhaler 5  . Calcium Carbonate-Vitamin D (CALTRATE 600+D) 600-400 MG-UNIT tablet Take 1 tablet by mouth 2 (two) times daily.    Marland Kitchen  citalopram (CELEXA) 20 MG tablet TAKE ONE TABLET BY MOUTH ONE TIME DAILY **MUST CALL DR. FOR APPOINTMENT** 30 tablet 0  . hydrochlorothiazide (HYDRODIURIL) 25 MG tablet TAKE ONE TABLET BY MOUTH ONE TIME DAILY (NO MORE REFILLS UNTIL SEEN BY PHYSICIAN) 30 tablet 5  . ibuprofen (ADVIL,MOTRIN) 200 MG tablet Take 200 mg by mouth every 6 (six) hours as needed.    . VENTOLIN HFA 108 (90 Base) MCG/ACT inhaler INHALE 2 PUFFS BY MOUTH INTO THE LUNGS EVERY 6 HOURS AS NEEDED FOR WHEEZING OR SHORTNESS OF BREATH 18 Inhaler 0  . Zoster Vac Recomb Adjuvanted [facility]) injection Inject 44mcg IM now and again in 2-6 months 0.5 mL 1   No current facility-administered medications on file prior to visit.     BP 138/83   Pulse 75   Temp 98.1 F (36.7 C) (Oral)   Resp 16   Ht 5\' 7"  (1.702 m)   Wt 222 lb (100.7 kg)   SpO2 97%   BMI 34.77 kg/m        Objective:   Physical Exam   General- No acute distress. Pleasant patient. . Lt forearm- mid aspect ventral region. 3 puncture wounds. Prior area of redness and induration 2 cm x 2 cm resolved. The largest puncture wound semi dried yellow dc from largest puncture site. Mid induration. No fluctuance. Not swollen. Does not look abscess like. The other two small puncture sites appear to be healing with some scabbing present.  Lt upper- ext- no palpable lymphadenopathy.       Assessment & Plan:  For your infected cat bite/puncture wound despite being on  Augmentin antibiotic, we gave you 1 g Rocephin IM today.  I want you to stop Augmentin and will prescribe Flagyl and clindamycin.  You can do warm salt water soaks twice daily to the area.(gauze given to pt)  The largest puncture wound had some mild discharge and we got a culture of that today. That culture will be sent out and hopefully will have a final report on that by Monday.  The area does look infected now and we need to watch this closely. If the area gets larger with more inflammation, redness, tenderness, softness in the center and more discharge then be seen at the emergency department. In some cases abscess can form but it does not appear that this is a case presently.  I do want you to follow-up on Monday morning at 10:30 am. Depending on how the wound looks, I might need to refer you to specialist for I&D. Hopefully with injection and new antibiotic combination the area will resolve.  Follow-up on Monday or as needed in the emergency department for any complications.  Note Up to date reviewed again and alternative regimen of antibiotic written in light of worsening infection despite augmentin. Also advised can get probiotic otc while on antibiotic.  Magally Vahle, Percell Miller, PA-C

## 2017-03-17 NOTE — Patient Instructions (Addendum)
For your infected cat bite/puncture wound despite being on Augmentin antibiotic, we gave you 1 g Rocephin IM today.  I want you to stop Augmentin and will prescribe Flagyl and clindamycin.  You can do warm salt water soaks twice daily to the area.  The largest puncture wound had some mild discharge and we got a culture of that today. That culture will be sent out and hopefully will have a final report on that by Monday.  The area does look infected now and we need to watch this closely. If the area gets larger with more inflammation, redness, tenderness, softness in the center and more discharge then be seen at the emergency department. In some cases abscess can form but it does not appear that this is a case presently.(If abscess forms would need I and D)  I do want you to follow-up on Monday morning at 10:30 am. Depending on how the wound looks, I might need to refer you to specialist for I&D. Hopefully with injection and new antibiotic combination the area will resolve.  Follow-up on Monday or as needed in the emergency department for any complications.

## 2017-03-20 ENCOUNTER — Ambulatory Visit (INDEPENDENT_AMBULATORY_CARE_PROVIDER_SITE_OTHER): Payer: 59 | Admitting: Medical

## 2017-03-20 VITALS — BP 135/83 | HR 73 | Temp 98.5°F | Resp 16 | Ht 67.0 in | Wt 218.6 lb

## 2017-03-20 DIAGNOSIS — S51852A Open bite of left forearm, initial encounter: Secondary | ICD-10-CM | POA: Diagnosis not present

## 2017-03-20 DIAGNOSIS — W5501XD Bitten by cat, subsequent encounter: Secondary | ICD-10-CM | POA: Diagnosis not present

## 2017-03-20 DIAGNOSIS — L089 Local infection of the skin and subcutaneous tissue, unspecified: Secondary | ICD-10-CM | POA: Diagnosis not present

## 2017-03-20 NOTE — Patient Instructions (Signed)
Your left forearm cat bite with secondary infection looks a lot better than it did on Friday.  The wound culture surprisingly did not grow out a bacteria. We'll continue to follow to make sure there is no final culture pending. Based on the appearance Friday I would continue the clindamycin and the Flagyl.  I expect the area to progressively improve. If you're having too many side effects with antibiotics please let me know and at that point might modify the dosing/instruction.   At this point would recommend follow-up only as needed for worsening or persisting symptoms.   I expect gradual improvement over next 7-10 days.

## 2017-03-20 NOTE — Progress Notes (Signed)
Subjective:    Patient ID: Wendy Chambers, female    DOB: 1955/12/13, 61 y.o.   MRN: [id number]  HPI   Pt in for follow up.   Pt is on antibiotic both flagyl and clindamycin for her likely infection based on exam on Friday and clinical presentation.(Please see that prior note) Her wound since this weekend has improved. Pt has not seen any creamy yellow discharge.   No fever, no chills or sweats.  Patient reports some mild nausea. But no vomiting or any diarrhea reported.     Review of Systems  Constitutional: Negative for chills, fatigue and fever.  Respiratory: Negative for cough, shortness of breath and wheezing.   Cardiovascular: Negative for chest pain and palpitations.  Gastrointestinal: Positive for nausea. Negative for abdominal pain and vomiting.       See history of present illness and probably antibiotic related.  Musculoskeletal: Negative for back pain.  Skin:       See history of present illness  Neurological: Negative for dizziness and light-headedness.  Hematological: Negative for adenopathy. Does not bruise/bleed easily.  Psychiatric/Behavioral: Negative for behavioral problems and confusion.    Past Medical History:  Diagnosis Date  . Asthma   . Colon polyp 2010  . History of chicken pox    childhood  . History of migraine   . Hypertension      Social History   Social History  . Marital status: Legally Separated    Spouse name: N/A  . Number of children: N/A  . Years of education: N/A   Occupational History  . Not on file.   Social History Main Topics  . Smoking status: Former Research scientist (life sciences)  . Smokeless tobacco: Never Used     Comment: Quit 1990 - 1 ppd  . Alcohol use 0.0 oz/week  . Drug use: No  . Sexual activity: Not on file   Other Topics Concern  . 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    Past Surgical History:  Procedure Laterality Date  . CESAREAN SECTION  2010    Family History  Problem Relation Age of Onset  . Heart disease Cousin        chf, on father's side, CAD, female  . Heart disease Cousin        chf, on father's side, female  . Lung cancer Father        died at 37  . Hyperlipidemia Father   . Hypertension Father   . Hyperlipidemia Mother   . Heart disease Paternal Grandmother   . Diabetes Neg Hx   . Heart attack Neg Hx     Allergies  Allergen Reactions  . Lisinopril     Other reaction(s): Dizziness (intolerance)  . Losartan Potassium     Other reaction(s): Cough (ALLERGY/intolerance)  . Tetracyclines & Related     Rash    Current Outpatient Prescriptions on File Prior to Visit  Medication Sig Dispense Refill  . amLODipine (NORVASC) 5 MG tablet TAKE ONE TABLET BY MOUTH ONE TIME DAILY 30 tablet 0  . beclomethasone (QVAR REDIHALER) 40 MCG/ACT inhaler Inhale 2 puffs into the lungs 2 (two) times daily. 1 Inhaler 5  . Calcium Carbonate-Vitamin D (CALTRATE 600+D) 600-400 MG-UNIT tablet Take 1 tablet by mouth 2 (two) times daily.    . citalopram (CELEXA) 20  MG tablet TAKE ONE TABLET BY MOUTH ONE TIME DAILY **MUST CALL DR. FOR APPOINTMENT** 30 tablet 0  . clindamycin (CLEOCIN) 300 MG capsule Take 1 capsule (300 mg total) by mouth 3 (three) times daily. 21 capsule 0  . hydrochlorothiazide (HYDRODIURIL) 25 MG tablet TAKE ONE TABLET BY MOUTH ONE TIME DAILY (NO MORE REFILLS UNTIL SEEN BY PHYSICIAN) 30 tablet 5  . ibuprofen (ADVIL,MOTRIN) 200 MG tablet Take 200 mg by mouth every 6 (six) hours as needed.    . metroNIDAZOLE (FLAGYL) 500 MG tablet Take 1 tablet (500 mg total) by mouth 3 (three) times daily. 21 tablet 0  . VENTOLIN HFA 108 (90 Base) MCG/ACT inhaler INHALE 2 PUFFS BY MOUTH INTO THE LUNGS EVERY 6 HOURS AS NEEDED FOR WHEEZING OR SHORTNESS OF BREATH 18 Inhaler 0  . Zoster Vac Recomb Adjuvanted [facility]) injection Inject 27mcg IM now  and again in 2-6 months 0.5 mL 1   No current facility-administered medications on file prior to visit.     BP 135/83   Pulse 73   Temp 98.5 F (36.9 C) (Oral)   Resp 16   Ht 5\' 7"  (1.702 m)   Wt 218 lb 9.6 oz (99.2 kg)   SpO2 98%   BMI 34.24 kg/m       Objective:   Physical Exam    General- No acute distress. Pleasant patient. . Lt forearm- mid aspect ventral region.   The largest puncture wound has decreased in size about half size as on Friday.  Mild induration/less than before. No fluctuance. Not swollen. Does not look abscess like. The other 2 small puncture wounds now well healed.  Lt upper- ext- no palpable lymphadenopathy.    Assessment & Plan:  Your left forearm cat bite with secondary infection looks a lot better than it did on Friday.  The wound culture surprisingly did not grow out a bacteria. We'll continue to follow to make sure there is no final culture pending. Based on the appearance Friday I would continue the clindamycin and the Flagyl.  I expect the area to progressively improve. If you're having too many side effects with antibiotics please let me know and at that point might modify the dosing/instruction.   At this point would recommend follow-up only as needed for worsening or persisting symptoms.   I expect gradual improvement over next 7-10 days.   Soundra Lampley, Percell Miller, PA-C

## 2017-03-21 LAB — WOUND CULTURE
MICRO NUMBER: 81047452
SPECIMEN QUALITY:: ADEQUATE

## 2017-04-04 ENCOUNTER — Other Ambulatory Visit: Payer: Self-pay | Admitting: Family

## 2017-04-04 NOTE — Telephone Encounter (Signed)
Rx approved and sent to the pharmacy by e-script.//AB/CMA 

## 2017-05-04 ENCOUNTER — Other Ambulatory Visit: Payer: Self-pay | Admitting: Family

## 2017-05-11 ENCOUNTER — Telehealth: Payer: Self-pay | Admitting: Family

## 2017-05-12 NOTE — Telephone Encounter (Signed)
Pt last seen by PCP 12/29/16 and was due for follow up on 03/01/17. Pt is past due. A 2 week supply of Citalopram has been sent to the pharmacy. Pt has not read previous mychart message telling her she is past due for follow up. Please call pt to schedule appt with [name] soon. Thanks!

## 2017-05-17 NOTE — Telephone Encounter (Signed)
Made second call to pt. She stated that she received previous message left to call back to schedule. Pt says that she don't have time for apt, and that she will call us back whenever she do.

## 2017-05-17 NOTE — Telephone Encounter (Signed)
[name]-- please advise? 

## 2017-05-17 NOTE — Telephone Encounter (Signed)
Needs OV prior to additional refills.  

## 2017-05-22 ENCOUNTER — Encounter: Payer: Self-pay | Admitting: *Deleted

## 2017-07-10 ENCOUNTER — Other Ambulatory Visit: Payer: Self-pay | Admitting: Family

## 2017-07-20 ENCOUNTER — Encounter: Payer: Self-pay | Admitting: Family

## 2017-07-20 ENCOUNTER — Ambulatory Visit: Payer: 59 | Admitting: Family

## 2017-07-20 VITALS — BP 143/76 | HR 78 | Temp 98.7°F | Resp 16 | Ht 67.0 in | Wt 222.2 lb

## 2017-07-20 DIAGNOSIS — H6090 Unspecified otitis externa, unspecified ear: Secondary | ICD-10-CM | POA: Diagnosis not present

## 2017-07-20 DIAGNOSIS — F329 Major depressive disorder, single episode, unspecified: Secondary | ICD-10-CM | POA: Diagnosis not present

## 2017-07-20 DIAGNOSIS — F32A Depression, unspecified: Secondary | ICD-10-CM

## 2017-07-20 DIAGNOSIS — I10 Essential (primary) hypertension: Secondary | ICD-10-CM | POA: Diagnosis not present

## 2017-07-20 DIAGNOSIS — J45909 Unspecified asthma, uncomplicated: Secondary | ICD-10-CM | POA: Diagnosis not present

## 2017-07-20 LAB — BASIC METABOLIC PANEL
BUN: 14 mg/dL (ref 6–23)
CO2: 30 meq/L (ref 19–32)
Calcium: 9.3 mg/dL (ref 8.4–10.5)
Chloride: 100 mEq/L (ref 96–112)
Creatinine, Ser: 0.87 mg/dL (ref 0.40–1.20)
GFR: 70.79 mL/min (ref >60.00)
GLUCOSE: 101 mg/dL — AB (ref 70–99)
POTASSIUM: 4.6 meq/L (ref 3.5–5.1)
SODIUM: 137 meq/L (ref 135–145)

## 2017-07-20 MED ORDER — HYDROCHLOROTHIAZIDE 25 MG PO TABS: ORAL_TABLET | ORAL | 1 refills | Status: DC

## 2017-07-20 MED ORDER — CITALOPRAM HYDROBROMIDE 20 MG PO TABS: ORAL_TABLET | ORAL | 1 refills | Status: DC

## 2017-07-20 MED ORDER — CIPROFLOXACIN-HYDROCORTISONE 0.2-1 % OT SUSP
3.0000 [drp] | Freq: Two times a day (BID) | OTIC | 0 refills | Status: AC
Start: 2017-07-20 — End: 2017-07-27

## 2017-07-20 MED ORDER — CITALOPRAM HYDROBROMIDE 20 MG PO TABS: 30.0000 mg | ORAL_TABLET | Freq: Every day | ORAL | 1 refills | Status: DC

## 2017-07-20 NOTE — Patient Instructions (Addendum)
Please complete lab work prior to leaving. Begin ear drops for right ear. Increase citalopram to 30mg  (1.5 tabs once daily). Restart HCTZ for blood pressure.

## 2017-07-20 NOTE — Progress Notes (Signed)
Subjective:    Patient ID: Wendy Chambers, female    DOB: [birth year], 62 y.o.   MRN: [id number]  HPI  Wendy Chambers is a 62 yr old female who presents today for follow up.  HTN- patient is maintained on hctz 25mg . She ran out of her medication yesterday, did not take this AM.  BP Readings from Last 3 Encounters:  07/20/17 (!) 143/76  03/20/17 135/83  03/17/17 138/83   Ear fullness- right ear feels "blocked".  Reports that she has "a lot of goo" coming out of her ear and it "smells bad."    Depression/anxiety- maintained on citalopram. Feels like "fall hit me really hard."  Reports that mood goes down in the fall.  She often stops over the summer.  Reports that she has some work stress. Work has been really busy.  Has a feeling of dread on Sunday afternoon.  Having trouble motivating.   Asthma- reports that she took qvar for a few weeks, breathing improved, stopped qvar.   Review of Systems See HPI  Past Medical History:  Diagnosis Date  . Asthma   . Colon polyp 2010  . History of chicken pox    childhood  . History of migraine   . Hypertension      Social History   Socioeconomic History  . Marital status: Legally Separated    Spouse name: Not on file  . Number of children: Not on file  . Years of education: Not on file  . Highest education level: Not on file  Social Needs  . Financial resource strain: Not on file  . Food insecurity - worry: Not on file  . Food insecurity - inability: Not on file  . Transportation needs - medical: Not on file  . Transportation needs - non-medical: Not on file  Occupational History  . Not on file  Tobacco Use  . Smoking status: Former Research scientist (life sciences)  . Smokeless tobacco: Never Used  . Tobacco comment: Quit 1990 - 1 ppd  Substance and Sexual Activity  . Alcohol use: Yes    Alcohol/week: 0.0 oz  . Drug use: No  . Sexual activity: Not on file  Other Topics Concern  . 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    Past Surgical History:  Procedure Laterality Date  . CESAREAN SECTION  2010    Family History  Problem Relation Age of Onset  . Heart disease Cousin        chf, on father's side, CAD, female  . Heart disease Cousin        chf, on father's side, female  . Lung cancer Father        died at 73  . Hyperlipidemia Father   . Hypertension Father   . Hyperlipidemia Mother   . Heart disease Paternal Grandmother   . Diabetes Neg Hx   . Heart attack Neg Hx     Allergies  Allergen Reactions  . Lisinopril     Other reaction(s): Dizziness (intolerance)  . Losartan Potassium     Other reaction(s): Cough (ALLERGY/intolerance)  . Tetracyclines & Related     Rash    Current Outpatient Medications on File Prior to Visit  Medication Sig Dispense Refill  . Calcium Carbonate-Vitamin D (CALTRATE 600+D) 600-400 MG-UNIT tablet Take 1 tablet by mouth 2 (two) times  daily.    . citalopram (CELEXA) 20 MG tablet TAKE ONE TABLET BY MOUTH ONE TIME DAILY **MUST CALL DR. FOR APPOINTMENT** 14 tablet 0  . hydrochlorothiazide (HYDRODIURIL) 25 MG tablet TAKE ONE TABLET BY MOUTH ONE TIME DAILY (NO MORE REFILLS UNTIL SEEN BY PHYSICIAN) 30 tablet 5  . VENTOLIN HFA 108 (90 Base) MCG/ACT inhaler INHALE 2 PUFFS BY MOUTH INTO THE LUNGS EVERY 6 HOURS AS NEEDED FOR WHEEZING OR SHORTNESS OF BREATH 18 Inhaler 0   No current facility-administered medications on file prior to visit.     BP (!) 143/76 (BP Location: Right Arm, Patient Position: Sitting, Cuff Size: Large)   Pulse 78   Temp 98.7 F (37.1 C) (Oral)   Resp 16   Ht 5\' 7"  (1.702 m)   Wt 222 lb 3.2 oz (100.8 kg)   SpO2 98%   BMI 34.80 kg/m       Objective:   Physical Exam  Constitutional: She appears well-developed and well-nourished.  HENT:  Head: Normocephalic and atraumatic.  Right Ear: Tympanic membrane normal.    Left Ear: Tympanic membrane and ear canal normal.  Right canal with mild erythema  Cardiovascular: Normal rate, regular rhythm and normal heart sounds.  No murmur heard. Pulmonary/Chest: Effort normal and breath sounds normal. No respiratory distress. She has no wheezes.  Lymphadenopathy:    She has no cervical adenopathy.  Psychiatric: She has a normal mood and affect. Her behavior is normal. Judgment and thought content normal.          Assessment & Plan:  Otitis externa- will rx with cipro otic drops.  HTN- SBP mildly elevated, did not take meds because she ran out. Obtain follow up hctz, check bmet.  Depression- uncontrolled. Will increase celexa from 20mg  to 30mg  once daily.  Asthma- stable off qvar,  Monitor.

## 2017-07-25 ENCOUNTER — Other Ambulatory Visit: Payer: 59

## 2017-07-30 ENCOUNTER — Encounter: Payer: Self-pay | Admitting: Family

## 2017-07-31 MED ORDER — AMOXICILLIN-POT CLAVULANATE 875-125 MG PO TABS: 1.0000 | ORAL_TABLET | Freq: Two times a day (BID) | ORAL | 0 refills | Status: DC

## 2017-08-09 ENCOUNTER — Other Ambulatory Visit: Payer: 59

## 2017-08-30 ENCOUNTER — Ambulatory Visit: Payer: 59 | Admitting: Family

## 2017-10-01 ENCOUNTER — Other Ambulatory Visit: Payer: Self-pay | Admitting: Family

## 2017-10-27 ENCOUNTER — Other Ambulatory Visit: Payer: Self-pay | Admitting: Family

## 2017-10-27 ENCOUNTER — Ambulatory Visit
Admission: RE | Admit: 2017-10-27 | Discharge: 2017-10-27 | Disposition: A | Discharge status: Home / Self Care | Payer: No Typology Code available for payment source | Source: Ambulatory Visit | Attending: Family | Admitting: Family

## 2017-10-27 ENCOUNTER — Ambulatory Visit: Payer: Self-pay

## 2017-10-27 DIAGNOSIS — N6489 Other specified disorders of breast: Secondary | ICD-10-CM

## 2017-10-27 DIAGNOSIS — Z1231 Encounter for screening mammogram for malignant neoplasm of breast: Secondary | ICD-10-CM

## 2017-11-13 ENCOUNTER — Ambulatory Visit: Payer: No Typology Code available for payment source | Admitting: Family

## 2017-11-13 DIAGNOSIS — Z0289 Encounter for other administrative examinations: Secondary | ICD-10-CM

## 2017-11-17 ENCOUNTER — Encounter: Payer: Self-pay | Admitting: Family

## 2017-11-17 ENCOUNTER — Telehealth: Payer: Self-pay | Admitting: Family

## 2017-11-17 ENCOUNTER — Ambulatory Visit (HOSPITAL_BASED_OUTPATIENT_CLINIC_OR_DEPARTMENT_OTHER)
Admission: RE | Admit: 2017-11-17 | Discharge: 2017-11-17 | Disposition: A | Discharge status: Home / Self Care | Payer: Self-pay | Source: Ambulatory Visit | Attending: Family | Admitting: Family

## 2017-11-17 ENCOUNTER — Ambulatory Visit (INDEPENDENT_AMBULATORY_CARE_PROVIDER_SITE_OTHER): Payer: Self-pay | Admitting: Family

## 2017-11-17 VITALS — BP 157/89 | HR 76 | Temp 98.6°F | Resp 16 | Ht 67.0 in | Wt 222.0 lb

## 2017-11-17 DIAGNOSIS — R0781 Pleurodynia: Secondary | ICD-10-CM | POA: Insufficient documentation

## 2017-11-17 DIAGNOSIS — R918 Other nonspecific abnormal finding of lung field: Secondary | ICD-10-CM | POA: Insufficient documentation

## 2017-11-17 DIAGNOSIS — F32A Depression, unspecified: Secondary | ICD-10-CM

## 2017-11-17 DIAGNOSIS — F419 Anxiety disorder, unspecified: Secondary | ICD-10-CM

## 2017-11-17 DIAGNOSIS — F329 Major depressive disorder, single episode, unspecified: Secondary | ICD-10-CM

## 2017-11-17 DIAGNOSIS — I10 Essential (primary) hypertension: Secondary | ICD-10-CM

## 2017-11-17 MED ORDER — MELOXICAM 7.5 MG PO TABS: 7.5000 mg | ORAL_TABLET | Freq: Every day | ORAL | 0 refills | Status: DC

## 2017-11-17 MED ORDER — AMLODIPINE BESYLATE 5 MG PO TABS: 5.0000 mg | ORAL_TABLET | Freq: Every day | ORAL | 3 refills | Status: DC

## 2017-11-17 NOTE — Progress Notes (Signed)
Subjective:    Patient ID: Wendy Chambers, female    DOB: 1956-05-25, 62 y.o.   MRN: [id number]  HPI  Reports that she had a bad respiratory illness in the end of April. Had cough x 2-3 weeks. Ever since then she reports that it hurts to get out of chair, role over in bed, take a deep breath. Cough is resolved. Denies fever. Has been using advil/aleve with minimal improvement.    Depression/anxiety- Stopped citalopram a few months ago. Reports that mood is stable off of citalopram.     Review of Systems See HPI  Past Medical History:  Diagnosis Date  . Asthma   . Colon polyp 2010  . History of chicken pox    childhood  . History of migraine   . Hypertension      Social History   Socioeconomic History  . Marital status: Legally Separated    Spouse name: Not on file  . Number of children: Not on file  . Years of education: Not on file  . Highest education level: Not on file  Occupational History  . Not on file  Social Needs  . Financial resource strain: Not on file  . Food insecurity:    Worry: Not on file    Inability: Not on file  . Transportation needs:    Medical: Not on file    Non-medical: Not on file  Tobacco Use  . Smoking status: Former Research scientist (life sciences)  . Smokeless tobacco: Never Used  . Tobacco comment: Quit 1990 - 1 ppd  Substance and Sexual Activity  . Alcohol use: Yes    Alcohol/week: 0.0 oz  . Drug use: No  . Sexual activity: Not on file  Lifestyle  . Physical activity:    Days per week: Not on file    Minutes per session: Not on file  . Stress: Not on file  Relationships  . Social connections:    Talks on phone: Not on file    Gets together: Not on file    Attends religious service: Not on file    Active member of club or organization: Not on file    Attends meetings of clubs or organizations: Not on file    Relationship status: Not on file  . Intimate partner violence:    Fear of current or ex partner: Not on file    Emotionally abused:  Not on file    Physically abused: Not on file    Forced sexual activity: Not on file  Other Topics Concern  . 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    Past Surgical History:  Procedure Laterality Date  . CESAREAN SECTION  2010    Family History  Problem Relation Age of Onset  . Heart disease Cousin        chf, on father's side, CAD, female  . Heart disease Cousin        chf, on father's side, female  . Lung cancer Father        died at 78  . Hyperlipidemia Father   . Hypertension Father   . Hyperlipidemia Mother   . Heart disease Paternal Grandmother   . Diabetes Neg Hx   . Heart attack Neg Hx     Allergies  Allergen Reactions  . Lisinopril     Other reaction(s):  Dizziness (intolerance)  . Losartan Potassium     Other reaction(s): Cough (ALLERGY/intolerance)  . Tetracyclines & Related     Rash    Current Outpatient Medications on File Prior to Visit  Medication Sig Dispense Refill  . Calcium Carbonate-Vitamin D (CALTRATE 600+D) 600-400 MG-UNIT tablet Take 1 tablet by mouth 2 (two) times daily.    . hydrochlorothiazide (HYDRODIURIL) 25 MG tablet TAKE ONE TABLET BY MOUTH ONE TIME DAILY (NO MORE REFILLS UNTIL SEEN) 15 tablet 0  . VENTOLIN HFA 108 (90 Base) MCG/ACT inhaler INHALE 2 PUFFS BY MOUTH INTO THE LUNGS EVERY 6 HOURS AS NEEDED FOR WHEEZING OR SHORTNESS OF BREATH 18 Inhaler 0   No current facility-administered medications on file prior to visit.     BP (!) 157/89 (BP Location: Right Arm, Patient Position: Sitting, Cuff Size: Large)   Pulse 76   Temp 98.6 F (37 C) (Oral)   Resp 16   Ht 5\' 7"  (1.702 m)   Wt 222 lb (100.7 kg)   SpO2 93%   BMI 34.77 kg/m       Objective:   Physical Exam  Constitutional: She appears well-developed and well-nourished.  Cardiovascular: Normal rate, regular rhythm and normal  heart sounds.  No murmur heard. Pulmonary/Chest: Effort normal and breath sounds normal. No respiratory distress. She has no wheezes.  Psychiatric: She has a normal mood and affect. Her behavior is normal. Judgment and thought content normal.          Assessment & Plan:  HTN- uncontrolled. Add amlodipine 5mg  once daily.  BP Readings from Last 3 Encounters:  11/17/17 (!) 157/89  07/20/17 (!) 143/76  03/20/17 135/83   Pleuritic chest pain- suspect musculoskeletal pain. Will check cxr, add meloxicam. Pt is advised to follow up if symptoms worsen or if they fail to improve.   Anxiety/depression- stable off of medications. Monitor.

## 2017-11-17 NOTE — Telephone Encounter (Signed)
See my chart message

## 2017-11-17 NOTE — Patient Instructions (Signed)
Please complete  Chest x ray on the first floor. Begin meloxicam. Call if new/worsening symptoms or if symptoms do not improve in the next 1-2 week.

## 2018-01-05 ENCOUNTER — Other Ambulatory Visit: Payer: No Typology Code available for payment source

## 2018-02-22 ENCOUNTER — Other Ambulatory Visit: Payer: Self-pay | Admitting: Family

## 2018-02-23 NOTE — Telephone Encounter (Signed)
30 day supply of HCTZ sent to pharmacy. Pt past due for f/u. Mychart message sent.

## 2018-03-22 ENCOUNTER — Other Ambulatory Visit: Payer: Self-pay | Admitting: Family

## 2018-04-18 DIAGNOSIS — F4323 Adjustment disorder with mixed anxiety and depressed mood: Secondary | ICD-10-CM | POA: Diagnosis not present

## 2018-04-20 ENCOUNTER — Ambulatory Visit: Payer: BLUE CROSS/BLUE SHIELD | Admitting: Family

## 2018-04-20 ENCOUNTER — Encounter: Payer: Self-pay | Admitting: Family

## 2018-04-20 VITALS — BP 136/90 | HR 67 | Temp 98.0°F | Resp 18 | Ht 67.0 in | Wt 221.6 lb

## 2018-04-20 DIAGNOSIS — I10 Essential (primary) hypertension: Secondary | ICD-10-CM | POA: Diagnosis not present

## 2018-04-20 DIAGNOSIS — R300 Dysuria: Secondary | ICD-10-CM

## 2018-04-20 DIAGNOSIS — R05 Cough: Secondary | ICD-10-CM

## 2018-04-20 DIAGNOSIS — F419 Anxiety disorder, unspecified: Secondary | ICD-10-CM

## 2018-04-20 DIAGNOSIS — R059 Cough, unspecified: Secondary | ICD-10-CM

## 2018-04-20 DIAGNOSIS — N390 Urinary tract infection, site not specified: Secondary | ICD-10-CM

## 2018-04-20 DIAGNOSIS — R319 Hematuria, unspecified: Secondary | ICD-10-CM

## 2018-04-20 LAB — POC URINALSYSI DIPSTICK (AUTOMATED)
BILIRUBIN UA: NEGATIVE
GLUCOSE UA: NEGATIVE
KETONES UA: NEGATIVE
Leukocytes, UA: NEGATIVE
Nitrite, UA: NEGATIVE
Protein, UA: NEGATIVE
Spec Grav, UA: 1.01 (ref 1.010–1.025)
Urobilinogen, UA: 0.2 E.U./dL
pH, UA: 6 (ref 5.0–8.0)

## 2018-04-20 MED ORDER — HYDROCHLOROTHIAZIDE 25 MG PO TABS: ORAL_TABLET | ORAL | 1 refills | Status: DC

## 2018-04-20 MED ORDER — MONTELUKAST SODIUM 10 MG PO TABS: 10.0000 mg | ORAL_TABLET | Freq: Every day | ORAL | 3 refills | Status: DC

## 2018-04-20 MED ORDER — CEPHALEXIN 500 MG PO CAPS: 500.0000 mg | ORAL_CAPSULE | Freq: Two times a day (BID) | ORAL | 0 refills | Status: DC

## 2018-04-20 MED ORDER — CITALOPRAM HYDROBROMIDE 20 MG PO TABS: 30.0000 mg | ORAL_TABLET | Freq: Every day | ORAL | 1 refills | Status: DC

## 2018-04-20 NOTE — Progress Notes (Signed)
Subjective:    Patient ID: Wendy Chambers, female    DOB: 1955/11/03, 62 y.o.   MRN: [id number]  HPI  Patient presents today for follow up:  HTN- on amlodipine, ran out of hctz.  BP Readings from Last 3 Encounters:  04/20/18 136/90  11/17/17 (!) 157/89  07/20/17 (!) 143/76   Cough- clear x 2 weeks.  Reports that she feels like allergies start the symptoms, then she wheezes.  She denies nasal drainage.  Using albuterol inhaler without improvement in her symptoms.   Dysuria- dysuria x 2 weeks. Reports increased frequency. Denies low back pain. Reports occasional nausea which is mild the last few days.   Anxiety- restarted citalopram in April due to worsening symptoms. Reports that she was laid off in 2022-08-29. Friend died in September 27, 2022 and she was left as the executor.  Child went to [facility].  He was suicidal and was hospitalized.  He is now back at home and is doing better.   She reports that she is focusing better at work but still having anxiety symptoms.     Review of Systems See HPI  Past Medical History:  Diagnosis Date  . Asthma   . Colon polyp 09-26-2008  . History of chicken pox    childhood  . History of migraine   . Hypertension      Social History   Socioeconomic History  . Marital status: Legally Separated    Spouse name: Not on file  . Number of children: Not on file  . Years of education: Not on file  . Highest education level: Not on file  Occupational History  . Not on file  Social Needs  . Financial resource strain: Not on file  . Food insecurity:    Worry: Not on file    Inability: Not on file  . Transportation needs:    Medical: Not on file    Non-medical: Not on file  Tobacco Use  . Smoking status: Former Research scientist (life sciences)  . Smokeless tobacco: Never Used  . Tobacco comment: Quit 1990 - 1 ppd  Substance and Sexual Activity  . Alcohol use: Yes    Alcohol/week: 0.0 standard drinks  . Drug use: No  . Sexual activity: Not on file  Lifestyle  . Physical  activity:    Days per week: Not on file    Minutes per session: Not on file  . Stress: Not on file  Relationships  . Social connections:    Talks on phone: Not on file    Gets together: Not on file    Attends religious service: Not on file    Active member of club or organization: Not on file    Attends meetings of clubs or organizations: Not on file    Relationship status: Not on file  . Intimate partner violence:    Fear of current or ex partner: Not on file    Emotionally abused: Not on file    Physically abused: Not on file    Forced sexual activity: Not on file  Other Topics Concern  . 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    Past Surgical History:  Procedure Laterality Date  . CESAREAN SECTION  2008-09-26    Family History  Problem Relation Age of Onset  . Heart disease Cousin  chf, on father's side, CAD, female  . Heart disease Cousin        chf, on father's side, female  . Lung cancer Father        died at 20  . Hyperlipidemia Father   . Hypertension Father   . Hyperlipidemia Mother   . Heart disease Paternal Grandmother   . Diabetes Neg Hx   . Heart attack Neg Hx     Allergies  Allergen Reactions  . Lisinopril     Other reaction(s): Dizziness (intolerance)  . Losartan Potassium     Other reaction(s): Cough (ALLERGY/intolerance)  . Tetracyclines & Related     Rash    Current Outpatient Medications on File Prior to Visit  Medication Sig Dispense Refill  . amLODipine (NORVASC) 5 MG tablet Take 1 tablet (5 mg total) by mouth daily. 30 tablet 3  . Calcium Carbonate-Vitamin D (CALTRATE 600+D) 600-400 MG-UNIT tablet Take 1 tablet by mouth 2 (two) times daily.    . citalopram (CELEXA) 20 MG tablet Take 20 mg by mouth daily.    . hydrochlorothiazide (HYDRODIURIL) 25 MG tablet TAKE ONE TABLET BY MOUTH ONE TIME  DAILY (NO MORE REFILLS UNTIL SEEN) 15 tablet 0  . meloxicam (MOBIC) 7.5 MG tablet Take 1 tablet (7.5 mg total) by mouth daily. 14 tablet 0  . VENTOLIN HFA 108 (90 Base) MCG/ACT inhaler INHALE 2 PUFFS BY MOUTH INTO THE LUNGS EVERY 6 HOURS AS NEEDED FOR WHEEZING OR SHORTNESS OF BREATH 18 Inhaler 0   No current facility-administered medications on file prior to visit.     BP 136/90 (BP Location: Right Arm, Cuff Size: Normal)   Pulse 67   Temp 98 F (36.7 C) (Oral)   Resp 18   Ht 5\' 7"  (1.702 m)   Wt 221 lb 9.6 oz (100.5 kg)   SpO2 98%   BMI 34.71 kg/m       Objective:   Physical Exam  Constitutional: She appears well-developed and well-nourished.  HENT:  Right Ear: Tympanic membrane and ear canal normal.  Left Ear: Tympanic membrane and ear canal normal.  Cardiovascular: Normal rate, regular rhythm and normal heart sounds.  No murmur heard. Pulmonary/Chest: Effort normal and breath sounds normal. No respiratory distress. She has no wheezes.  Psychiatric: She has a normal mood and affect. Her behavior is normal. Judgment and thought content normal.          Assessment & Plan:  HTN- on amlodipine, restart hctz.  Fair bp today.  Will need follow up bmet next visit.  Anxiety- uncontrolled. Increase citalopram from 20mg  to 30mg  once daily.  UTI- + blood in dip, send for culture, begin keflex.  Cough- has associated intermittent wheezing, normal lung exam today. Add singulair once daily.

## 2018-04-20 NOTE — Patient Instructions (Addendum)
Please schedule a fasting physical at your earliest convenience and we can do your pap smear at that visit. Please begin singulair for wheezing/cough once daily. Continue albuterol as needed for wheezing. Increase citalopram from 20mg  daily to 30mg  once daily.   Begin keflex (antibiotic) for possible urinary tract infection.

## 2018-04-21 ENCOUNTER — Other Ambulatory Visit: Payer: Self-pay | Admitting: Family

## 2018-04-22 LAB — URINE CULTURE
MICRO NUMBER: 91286550
SPECIMEN QUALITY: ADEQUATE

## 2018-04-23 ENCOUNTER — Encounter: Payer: Self-pay | Admitting: Family

## 2018-05-18 ENCOUNTER — Encounter: Payer: Self-pay | Admitting: Family

## 2018-05-18 ENCOUNTER — Ambulatory Visit: Payer: BLUE CROSS/BLUE SHIELD | Admitting: Family

## 2018-05-18 VITALS — BP 125/61 | HR 72 | Temp 98.3°F | Resp 18 | Ht 67.0 in | Wt 222.0 lb

## 2018-05-18 DIAGNOSIS — I10 Essential (primary) hypertension: Secondary | ICD-10-CM | POA: Diagnosis not present

## 2018-05-18 DIAGNOSIS — F419 Anxiety disorder, unspecified: Secondary | ICD-10-CM | POA: Diagnosis not present

## 2018-05-18 DIAGNOSIS — R350 Frequency of micturition: Secondary | ICD-10-CM

## 2018-05-18 DIAGNOSIS — R3129 Other microscopic hematuria: Secondary | ICD-10-CM | POA: Diagnosis not present

## 2018-05-18 LAB — URINALYSIS, ROUTINE W REFLEX MICROSCOPIC
Bilirubin Urine: NEGATIVE
KETONES UR: NEGATIVE
Nitrite: NEGATIVE
PH: 7 (ref 5.0–8.0)
Total Protein, Urine: NEGATIVE
URINE GLUCOSE: NEGATIVE
UROBILINOGEN UA: 0.2 (ref 0.0–1.0)

## 2018-05-18 LAB — POC URINALSYSI DIPSTICK (AUTOMATED)
Bilirubin, UA: NEGATIVE
Glucose, UA: NEGATIVE
Ketones, UA: NEGATIVE
NITRITE UA: NEGATIVE
PH UA: 7.5 (ref 5.0–8.0)
Protein, UA: NEGATIVE
SPEC GRAV UA: 1.015 (ref 1.010–1.025)
UROBILINOGEN UA: 0.2 U/dL

## 2018-05-18 LAB — BASIC METABOLIC PANEL
BUN: 16 mg/dL (ref 6–23)
CALCIUM: 9 mg/dL (ref 8.4–10.5)
CO2: 31 mEq/L (ref 19–32)
Chloride: 100 mEq/L (ref 96–112)
Creatinine, Ser: 0.8 mg/dL (ref 0.40–1.20)
GFR: 77.77 mL/min (ref >60.00)
Glucose, Bld: 96 mg/dL (ref 70–99)
Potassium: 3.9 mEq/L (ref 3.5–5.1)
SODIUM: 138 meq/L (ref 135–145)

## 2018-05-18 NOTE — Addendum Note (Signed)
Addended by: Harl Bowie on: 05/18/2018 10:00 AM   Modules accepted: Orders

## 2018-05-18 NOTE — Progress Notes (Signed)
Subjective:    Patient ID: Wendy Chambers, female    DOB: 1955/10/04, 62 y.o.   MRN: [id number]  HPI   Patient is a 62 yr old female who presents today for follow up.  Anxiety- last visit she noted uncontrolled anxiety. We increased citalopram from 20mg  to 30mg .  She reports feeling better on this dose. Able to concentrate on work. Son is doing better and has restarted college this past week.   HTN-  last visit bp was mildly elevated on amlodipine.  We restarted hctz.   BP Readings from Last 3 Encounters:  05/18/18 125/61  04/20/18 136/90  11/17/17 (!) 157/89   Denies Dysuria.      Review of Systems See HPI  Past Medical History:  Diagnosis Date  . Asthma   . Colon polyp 2010  . History of chicken pox    childhood  . History of migraine   . Hypertension      Social History   Socioeconomic History  . Marital status: Legally Separated    Spouse name: Not on file  . Number of children: Not on file  . Years of education: Not on file  . Highest education level: Not on file  Occupational History  . Not on file  Social Needs  . Financial resource strain: Not on file  . Food insecurity:    Worry: Not on file    Inability: Not on file  . Transportation needs:    Medical: Not on file    Non-medical: Not on file  Tobacco Use  . Smoking status: Former Research scientist (life sciences)  . Smokeless tobacco: Never Used  . Tobacco comment: Quit 1990 - 1 ppd  Substance and Sexual Activity  . Alcohol use: Yes    Alcohol/week: 0.0 standard drinks  . Drug use: No  . Sexual activity: Not on file  Lifestyle  . Physical activity:    Days per week: Not on file    Minutes per session: Not on file  . Stress: Not on file  Relationships  . Social connections:    Talks on phone: Not on file    Gets together: Not on file    Attends religious service: Not on file    Active member of club or organization: Not on file    Attends meetings of clubs or organizations: Not on file    Relationship  status: Not on file  . Intimate partner violence:    Fear of current or ex partner: Not on file    Emotionally abused: Not on file    Physically abused: Not on file    Forced sexual activity: Not on file  Other Topics Concern  . 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    Past Surgical History:  Procedure Laterality Date  . CESAREAN SECTION  2010    Family History  Problem Relation Age of Onset  . Heart disease Cousin        chf, on father's side, CAD, female  . Heart disease Cousin        chf, on father's side, female  . Lung cancer Father        died at 58  . Hyperlipidemia Father   . Hypertension Father   . Hyperlipidemia Mother   . Heart disease Paternal Grandmother   . Diabetes Neg Hx   .  Heart attack Neg Hx     Allergies  Allergen Reactions  . Lisinopril     Other reaction(s): Dizziness (intolerance)  . Losartan Potassium     Other reaction(s): Cough (ALLERGY/intolerance)  . Tetracyclines & Related     Rash    Current Outpatient Medications on File Prior to Visit  Medication Sig Dispense Refill  . amLODipine (NORVASC) 5 MG tablet Take 1 tablet (5 mg total) by mouth daily. 30 tablet 3  . Calcium Carbonate-Vitamin D (CALTRATE 600+D) 600-400 MG-UNIT tablet Take 1 tablet by mouth 2 (two) times daily.    . cephALEXin (KEFLEX) 500 MG capsule Take 1 capsule (500 mg total) by mouth 2 (two) times daily. 10 capsule 0  . citalopram (CELEXA) 20 MG tablet Take 1.5 tablets (30 mg total) by mouth daily. 30 tablet 1  . hydrochlorothiazide (HYDRODIURIL) 25 MG tablet TAKE ONE TABLET BY MOUTH ONE TIME DAILY 90 tablet 1  . montelukast (SINGULAIR) 10 MG tablet Take 1 tablet (10 mg total) by mouth at bedtime. 30 tablet 3  . VENTOLIN HFA 108 (90 Base) MCG/ACT inhaler INHALE 2 PUFFS BY MOUTH INTO THE LUNGS EVERY 6 HOURS AS NEEDED FOR  WHEEZING OR SHORTNESS OF BREATH 18 Inhaler 0   No current facility-administered medications on file prior to visit.     BP 125/61 (BP Location: Right Arm, Cuff Size: Large)   Pulse 72   Temp 98.3 F (36.8 C) (Oral)   Resp 18   Ht 5\' 7"  (1.702 m)   Wt 222 lb (100.7 kg)   SpO2 96%   BMI 34.77 kg/m       Objective:   Physical Exam  Constitutional: She is oriented to person, place, and time. She appears well-developed and well-nourished.  Cardiovascular: Normal rate, regular rhythm and normal heart sounds.  No murmur heard. Pulmonary/Chest: Effort normal and breath sounds normal. No respiratory distress. She has no wheezes.  Neurological: She is alert and oriented to person, place, and time.  Skin: Skin is warm and dry.  Psychiatric: She has a normal mood and affect. Her behavior is normal. Judgment and thought content normal.          Assessment & Plan:  HTN- stable/improved. Continue current medications.  Obtain follow up bmet.  Anxiety- improved on current dose of citalopram. Continue same.  Microscopic hematuria- send for UA with micro. Asymptomatic at this time.

## 2018-05-18 NOTE — Patient Instructions (Signed)
Please complete lab work prior to leaving.  Continue current meds.

## 2018-05-19 LAB — URINE CULTURE
MICRO NUMBER:: 91411446
SPECIMEN QUALITY: ADEQUATE

## 2018-05-21 ENCOUNTER — Encounter: Payer: Self-pay | Admitting: Family

## 2018-06-05 DIAGNOSIS — L408 Other psoriasis: Secondary | ICD-10-CM | POA: Diagnosis not present

## 2018-06-05 DIAGNOSIS — L308 Other specified dermatitis: Secondary | ICD-10-CM | POA: Diagnosis not present

## 2018-06-20 ENCOUNTER — Other Ambulatory Visit: Payer: Self-pay | Admitting: Family

## 2018-07-05 ENCOUNTER — Encounter: Payer: Self-pay | Admitting: Family

## 2018-07-05 ENCOUNTER — Other Ambulatory Visit: Payer: Self-pay | Admitting: Family

## 2018-07-05 DIAGNOSIS — N6489 Other specified disorders of breast: Secondary | ICD-10-CM

## 2018-07-09 ENCOUNTER — Ambulatory Visit (HOSPITAL_BASED_OUTPATIENT_CLINIC_OR_DEPARTMENT_OTHER)
Admission: RE | Admit: 2018-07-09 | Discharge: 2018-07-09 | Disposition: A | Discharge status: Home / Self Care | Payer: BLUE CROSS/BLUE SHIELD | Source: Ambulatory Visit | Attending: Family | Admitting: Family

## 2018-07-09 ENCOUNTER — Ambulatory Visit: Payer: BLUE CROSS/BLUE SHIELD | Admitting: Family

## 2018-07-09 ENCOUNTER — Encounter: Payer: Self-pay | Admitting: Family

## 2018-07-09 VITALS — BP 122/97 | HR 72 | Temp 98.7°F | Resp 16 | Ht 67.0 in | Wt 225.0 lb

## 2018-07-09 DIAGNOSIS — J209 Acute bronchitis, unspecified: Secondary | ICD-10-CM

## 2018-07-09 DIAGNOSIS — J0100 Acute maxillary sinusitis, unspecified: Secondary | ICD-10-CM | POA: Diagnosis not present

## 2018-07-09 DIAGNOSIS — R05 Cough: Secondary | ICD-10-CM | POA: Diagnosis not present

## 2018-07-09 MED ORDER — BENZONATATE 100 MG PO CAPS: 100.0000 mg | ORAL_CAPSULE | Freq: Three times a day (TID) | ORAL | 0 refills | Status: DC | PRN

## 2018-07-09 MED ORDER — AMLODIPINE BESYLATE 5 MG PO TABS: 5.0000 mg | ORAL_TABLET | Freq: Every day | ORAL | 5 refills | Status: DC

## 2018-07-09 MED ORDER — PREDNISONE 10 MG PO TABS: ORAL_TABLET | ORAL | 0 refills | Status: DC

## 2018-07-09 MED ORDER — ALBUTEROL SULFATE HFA 108 (90 BASE) MCG/ACT IN AERS: INHALATION_SPRAY | RESPIRATORY_TRACT | 2 refills | Status: DC

## 2018-07-09 MED ORDER — AZITHROMYCIN 250 MG PO TABS: ORAL_TABLET | ORAL | 0 refills | Status: DC

## 2018-07-09 NOTE — Addendum Note (Signed)
Addended by: Debbrah Alar on: 07/09/2018 10:58 AM   Modules accepted: Orders

## 2018-07-09 NOTE — Patient Instructions (Signed)
Please complete x-ray on the first floor. Begin azithromycin and prednisone taper. Continue albuterol every 6 hours for next few days until wheezing/cough is improved. You may use tessalon as needed for cough.  Restart amlodipine.

## 2018-07-09 NOTE — Progress Notes (Signed)
Subjective:    Patient ID: Wendy Chambers, female    DOB: Dec 12, 1955, 63 y.o.   MRN: [id number]  HPI  Patient is a 63 yr old female who presents today with c/o productive cough, sinus pain/nasal congestion. + pain/pressure right cheek.  Symptoms started before her last visit on 11/22.  HA from coughing all the time. Denies known fever.   HTN-  BP Readings from Last 3 Encounters:  07/09/18 (!) 122/97  05/18/18 125/61  04/20/18 136/90     Review of Systems See HPI  Past Medical History:  Diagnosis Date  . Asthma   . Colon polyp 2010  . History of chicken pox    childhood  . History of migraine   . Hypertension      Social History   Socioeconomic History  . Marital status: Legally Separated    Spouse name: Not on file  . Number of children: Not on file  . Years of education: Not on file  . Highest education level: Not on file  Occupational History  . Not on file  Social Needs  . Financial resource strain: Not on file  . Food insecurity:    Worry: Not on file    Inability: Not on file  . Transportation needs:    Medical: Not on file    Non-medical: Not on file  Tobacco Use  . Smoking status: Former Research scientist (life sciences)  . Smokeless tobacco: Never Used  . Tobacco comment: Quit 1990 - 1 ppd  Substance and Sexual Activity  . Alcohol use: Yes    Alcohol/week: 0.0 standard drinks  . Drug use: No  . Sexual activity: Not on file  Lifestyle  . Physical activity:    Days per week: Not on file    Minutes per session: Not on file  . Stress: Not on file  Relationships  . Social connections:    Talks on phone: Not on file    Gets together: Not on file    Attends religious service: Not on file    Active member of club or organization: Not on file    Attends meetings of clubs or organizations: Not on file    Relationship status: Not on file  . Intimate partner violence:    Fear of current or ex partner: Not on file    Emotionally abused: Not on file    Physically  abused: Not on file    Forced sexual activity: Not on file  Other Topics Concern  . 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    Past Surgical History:  Procedure Laterality Date  . CESAREAN SECTION  2010    Family History  Problem Relation Age of Onset  . Heart disease Cousin        chf, on father's side, CAD, female  . Heart disease Cousin        chf, on father's side, female  . Lung cancer Father        died at 57  . Hyperlipidemia Father   . Hypertension Father   . Hyperlipidemia Mother   . Heart disease Paternal Grandmother   . Diabetes Neg Hx   . Heart attack Neg Hx     Allergies  Allergen Reactions  . Lisinopril     Other reaction(s): Dizziness (intolerance)  . Losartan Potassium  Other reaction(s): Cough (ALLERGY/intolerance)  . Tetracyclines & Related     Rash    Current Outpatient Medications on File Prior to Visit  Medication Sig Dispense Refill  . amLODipine (NORVASC) 5 MG tablet Take 1 tablet (5 mg total) by mouth daily. 30 tablet 3  . Calcium Carbonate-Vitamin D (CALTRATE 600+D) 600-400 MG-UNIT tablet Take 1 tablet by mouth 2 (two) times daily.    . citalopram (CELEXA) 20 MG tablet TAKE ONE AND ONE-HALF TABLET BY MOUTH DAILY 30 tablet 1  . hydrochlorothiazide (HYDRODIURIL) 25 MG tablet TAKE ONE TABLET BY MOUTH ONE TIME DAILY 90 tablet 1  . montelukast (SINGULAIR) 10 MG tablet Take 1 tablet (10 mg total) by mouth at bedtime. 30 tablet 3  . VENTOLIN HFA 108 (90 Base) MCG/ACT inhaler INHALE 2 PUFFS BY MOUTH INTO THE LUNGS EVERY 6 HOURS AS NEEDED FOR WHEEZING OR SHORTNESS OF BREATH 18 Inhaler 0   No current facility-administered medications on file prior to visit.     BP (!) 122/97 (BP Location: Left Arm, Patient Position: Sitting, Cuff Size: Large)   Pulse 72   Temp 98.7 F (37.1 C) (Oral)   Resp 16   Ht  5\' 7"  (1.702 m)   Wt 225 lb (102.1 kg)   SpO2 97%   BMI 35.24 kg/m       Objective:   Physical Exam Constitutional:      Appearance: She is well-developed.  HENT:     Right Ear: Tympanic membrane and ear canal normal.     Left Ear: Tympanic membrane and ear canal normal.     Nose:     Right Sinus: Maxillary sinus tenderness present. No frontal sinus tenderness.     Left Sinus: No maxillary sinus tenderness or frontal sinus tenderness.  Neck:     Musculoskeletal: Neck supple.     Thyroid: No thyromegaly.  Cardiovascular:     Rate and Rhythm: Normal rate and regular rhythm.     Heart sounds: Normal heart sounds. No murmur.  Pulmonary:     Effort: Pulmonary effort is normal. No respiratory distress.     Breath sounds: Examination of the left-upper field reveals wheezing. Examination of the left-middle field reveals wheezing. Examination of the left-lower field reveals wheezing. Wheezing present.  Lymphadenopathy:     Cervical: No cervical adenopathy.  Skin:    General: Skin is warm and dry.  Neurological:     Mental Status: She is alert and oriented to person, place, and time.  Psychiatric:        Behavior: Behavior normal.        Thought Content: Thought content normal.        Judgment: Judgment normal.           Assessment & Plan:  Sinusitis- rx with azithromycin.  Bronchitis with bronchospasm- rx with prednisone taper, albuterol PRN,  Zpak, check cxr to exclude pna.  HTN- BP uncontrolled. Pt had run out of amlodipine. Restart, follow up in 1 week.

## 2018-07-17 ENCOUNTER — Encounter: Payer: Self-pay | Admitting: Family

## 2018-07-17 ENCOUNTER — Ambulatory Visit: Payer: BLUE CROSS/BLUE SHIELD | Admitting: Family

## 2018-07-17 VITALS — BP 137/84 | HR 62 | Temp 98.7°F | Resp 18 | Wt 226.2 lb

## 2018-07-17 DIAGNOSIS — J40 Bronchitis, not specified as acute or chronic: Secondary | ICD-10-CM

## 2018-07-17 DIAGNOSIS — J329 Chronic sinusitis, unspecified: Secondary | ICD-10-CM

## 2018-07-17 DIAGNOSIS — I10 Essential (primary) hypertension: Secondary | ICD-10-CM

## 2018-07-17 NOTE — Progress Notes (Signed)
Subjective:    Patient ID: Wendy Chambers, female    DOB: 04-14-1956, 63 y.o.   MRN: [id number]  HPI  Patient presents today for follow up. We saw her on 07/09/17 and treated her with azithromycin and prednisone for sinusitis/bronchitis.  CXR was negative for pneumonia.   Cough is improved, no longer wheezing. She has not needed to use albuterol recently. Reports feeling "much better."    HTN- last visit we restarted her amlodipine.    Review of Systems See HPI  Past Medical History:  Diagnosis Date  . Asthma   . Colon polyp 2010  . History of chicken pox    childhood  . History of migraine   . Hypertension      Social History   Socioeconomic History  . Marital status: Legally Separated    Spouse name: Not on file  . Number of children: Not on file  . Years of education: Not on file  . Highest education level: Not on file  Occupational History  . Not on file  Social Needs  . Financial resource strain: Not on file  . Food insecurity:    Worry: Not on file    Inability: Not on file  . Transportation needs:    Medical: Not on file    Non-medical: Not on file  Tobacco Use  . Smoking status: Former Research scientist (life sciences)  . Smokeless tobacco: Never Used  . Tobacco comment: Quit 1990 - 1 ppd  Substance and Sexual Activity  . Alcohol use: Yes    Alcohol/week: 0.0 standard drinks  . Drug use: No  . Sexual activity: Not on file  Lifestyle  . Physical activity:    Days per week: Not on file    Minutes per session: Not on file  . Stress: Not on file  Relationships  . Social connections:    Talks on phone: Not on file    Gets together: Not on file    Attends religious service: Not on file    Active member of club or organization: Not on file    Attends meetings of clubs or organizations: Not on file    Relationship status: Not on file  . Intimate partner violence:    Fear of current or ex partner: Not on file    Emotionally abused: Not on file    Physically abused: Not  on file    Forced sexual activity: Not on file  Other Topics Concern  . 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    Past Surgical History:  Procedure Laterality Date  . CESAREAN SECTION  2010    Family History  Problem Relation Age of Onset  . Heart disease Cousin        chf, on father's side, CAD, female  . Heart disease Cousin        chf, on father's side, female  . Lung cancer Father        died at 77  . Hyperlipidemia Father   . Hypertension Father   . Hyperlipidemia Mother   . Heart disease Paternal Grandmother   . Diabetes Neg Hx   . Heart attack Neg Hx     Allergies  Allergen Reactions  . Lisinopril     Other reaction(s): Dizziness (intolerance)  . Losartan Potassium     Other reaction(s): Cough (  ALLERGY/intolerance)  . Tetracyclines & Related     Rash    Current Outpatient Medications on File Prior to Visit  Medication Sig Dispense Refill  . albuterol (VENTOLIN HFA) 108 (90 Base) MCG/ACT inhaler INHALE 2 PUFFS BY MOUTH INTO THE LUNGS EVERY 6 HOURS AS NEEDED FOR WHEEZING OR SHORTNESS OF BREATH 8 g 2  . amLODipine (NORVASC) 5 MG tablet Take 1 tablet (5 mg total) by mouth daily. 30 tablet 5  . benzonatate (TESSALON) 100 MG capsule Take 1 capsule (100 mg total) by mouth 3 (three) times daily as needed. 20 capsule 0  . Calcium Carbonate-Vitamin D (CALTRATE 600+D) 600-400 MG-UNIT tablet Take 1 tablet by mouth 2 (two) times daily.    . citalopram (CELEXA) 20 MG tablet TAKE ONE AND ONE-HALF TABLET BY MOUTH DAILY 30 tablet 1  . hydrochlorothiazide (HYDRODIURIL) 25 MG tablet TAKE ONE TABLET BY MOUTH ONE TIME DAILY 90 tablet 1  . montelukast (SINGULAIR) 10 MG tablet Take 1 tablet (10 mg total) by mouth at bedtime. 30 tablet 3   No current facility-administered medications on file prior to visit.     BP 137/84 (BP Location:  Left Arm, Patient Position: Sitting, Cuff Size: Normal)   Pulse 62   Temp 98.7 F (37.1 C) (Oral)   Resp 18   Wt 226 lb 3.2 oz (102.6 kg)   SpO2 98%   BMI 35.43 kg/m       Objective:   Physical Exam Constitutional:      Appearance: She is well-developed.  HENT:     Head: Normocephalic and atraumatic.     Right Ear: Tympanic membrane and ear canal normal.     Left Ear: Tympanic membrane and ear canal normal.     Mouth/Throat:     Mouth: Mucous membranes are moist.     Pharynx: No oropharyngeal exudate or posterior oropharyngeal erythema.  Neck:     Musculoskeletal: Neck supple.     Thyroid: No thyromegaly.  Cardiovascular:     Rate and Rhythm: Normal rate and regular rhythm.     Heart sounds: Normal heart sounds. No murmur.  Pulmonary:     Effort: Pulmonary effort is normal. No respiratory distress.     Breath sounds: Normal breath sounds. No wheezing.  Skin:    General: Skin is warm and dry.  Neurological:     Mental Status: She is alert and oriented to person, place, and time.  Psychiatric:        Behavior: Behavior normal.        Thought Content: Thought content normal.        Judgment: Judgment normal.           Assessment & Plan:  Bronchitis- clinically improved.  Monitor  Sinusitis- improved. Monitor.  HTN- bp is stable continue current meds.  BP Readings from Last 3 Encounters:  07/17/18 137/84  07/09/18 (!) 122/97  05/18/18 125/61

## 2018-07-18 ENCOUNTER — Ambulatory Visit: Payer: BLUE CROSS/BLUE SHIELD

## 2018-07-18 ENCOUNTER — Ambulatory Visit
Admission: RE | Admit: 2018-07-18 | Discharge: 2018-07-18 | Disposition: A | Discharge status: Home / Self Care | Payer: BLUE CROSS/BLUE SHIELD | Source: Ambulatory Visit | Attending: Family | Admitting: Family

## 2018-07-18 DIAGNOSIS — N6489 Other specified disorders of breast: Secondary | ICD-10-CM

## 2018-07-18 DIAGNOSIS — R928 Other abnormal and inconclusive findings on diagnostic imaging of breast: Secondary | ICD-10-CM | POA: Diagnosis not present

## 2018-08-19 ENCOUNTER — Other Ambulatory Visit: Payer: Self-pay | Admitting: Family Medicine

## 2018-10-02 ENCOUNTER — Ambulatory Visit (INDEPENDENT_AMBULATORY_CARE_PROVIDER_SITE_OTHER): Payer: BLUE CROSS/BLUE SHIELD | Admitting: Family

## 2018-10-02 ENCOUNTER — Encounter: Payer: Self-pay | Admitting: Family

## 2018-10-02 ENCOUNTER — Telehealth: Payer: Self-pay | Admitting: *Deleted

## 2018-10-02 ENCOUNTER — Other Ambulatory Visit: Payer: Self-pay

## 2018-10-02 DIAGNOSIS — J40 Bronchitis, not specified as acute or chronic: Secondary | ICD-10-CM | POA: Diagnosis not present

## 2018-10-02 MED ORDER — AZITHROMYCIN 250 MG PO TABS: ORAL_TABLET | ORAL | 0 refills | Status: DC

## 2018-10-02 MED ORDER — PREDNISONE 10 MG PO TABS: ORAL_TABLET | ORAL | 0 refills | Status: DC

## 2018-10-02 NOTE — Progress Notes (Signed)
Virtual Visit via Video Note  I connected with Wendy Chambers  on 10/02/18 at 10:20 AM EDT by a video enabled telemedicine application and verified that I am speaking with the correct person using two identifiers. This visit type was conducted due to national recommendations for restrictions regarding the COVID-19 Pandemic (e.g. social distancing).  This format is felt to be most appropriate for this patient at this time.   I discussed the limitations of evaluation and management by telemedicine and the availability of in person appointments. The patient expressed understanding and agreed to proceed.  Only the patient and myself were on today's video visit. The patient was at home and I was in my office at the time of today's visit.   History of Present Illness:  Reports + cough which began about 1 week ago.  Denies associated fever.  She has been using albuterol as needed with some improvement.  Reports that cough is dry.  Reports an associated pain in the middle of her chest which is worsened by coughing and consistent with the discomfort she felt last time she had bronchitis.  Denies sore throat. Denies nasal drainage.     Observations/Objective:   Gen: Awake, alert, no acute distress Resp: Breathing is even and non-labored Psych: calm/pleasant demeanor Neuro: Alert and Oriented x 3, + facial symmetry, speech is clear.  Assessment and Plan:  Bronchitis- advised pt to continue albuterol every 6 hours as needed. Will rx with azithromycin and prednisone taper.  Pt is advised to call if symptoms worsen, if fever >101, or if increased SOB. She is advised to go to the ED if symptoms are severe.  Follow Up Instructions:    I discussed the assessment and treatment plan with the patient. The patient was provided an opportunity to ask questions and all were answered. The patient agreed with the plan and demonstrated an understanding of the instructions.   The patient was advised to call back or  seek an in-person evaluation if the symptoms worsen or if the condition fails to improve as anticipated.    Nance Pear, NP

## 2018-10-02 NOTE — Telephone Encounter (Signed)
Please contact pt to schedule OV today.

## 2018-10-02 NOTE — Telephone Encounter (Signed)
Pharmacy called about sig on zpak.  Advised that sig should be 2 tabs now then 1 tablet daily until finished.

## 2018-10-16 ENCOUNTER — Other Ambulatory Visit: Payer: Self-pay | Admitting: Family

## 2018-10-16 ENCOUNTER — Other Ambulatory Visit: Payer: Self-pay | Admitting: Family Medicine

## 2018-11-16 ENCOUNTER — Ambulatory Visit: Payer: BLUE CROSS/BLUE SHIELD | Admitting: Family

## 2018-12-06 IMAGING — MG DIGITAL SCREENING BILATERAL MAMMOGRAM WITH CAD
6 series · 6 of 6 positions shown · non-contrast
Comparison: Previous exam(s).

CLINICAL DATA: Screening.

EXAM:
DIGITAL SCREENING BILATERAL MAMMOGRAM WITH CAD

[L XCCL]
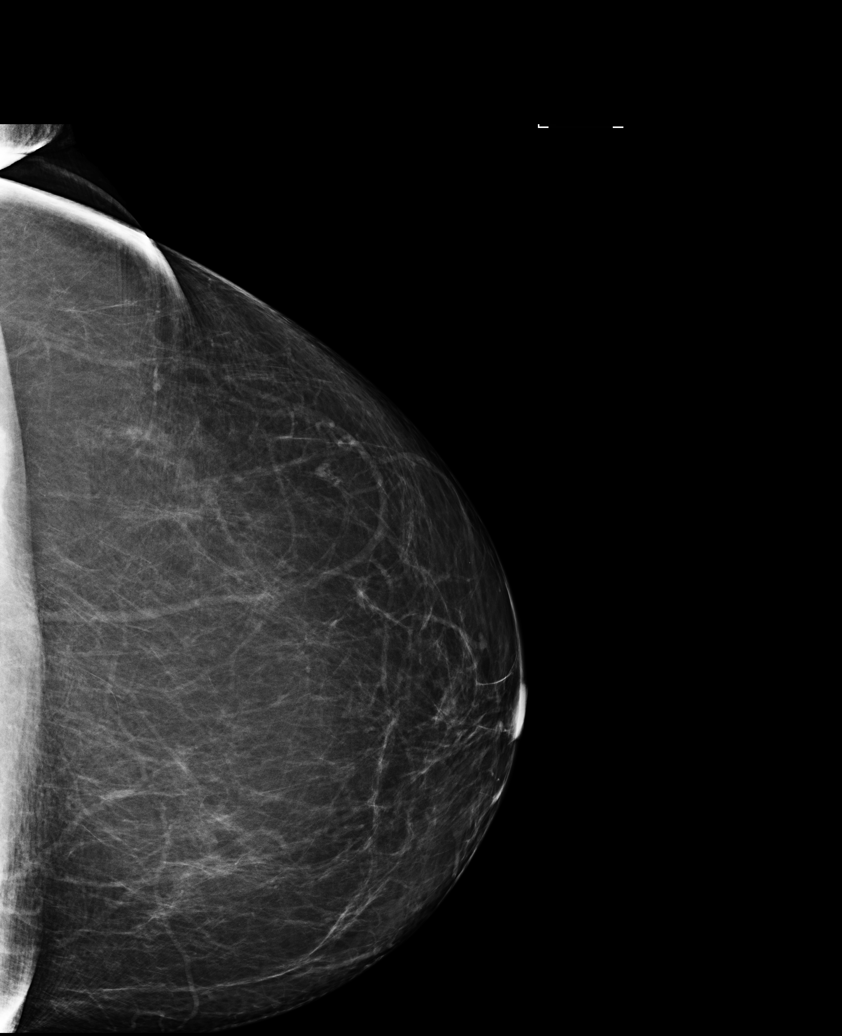

[R MLO]
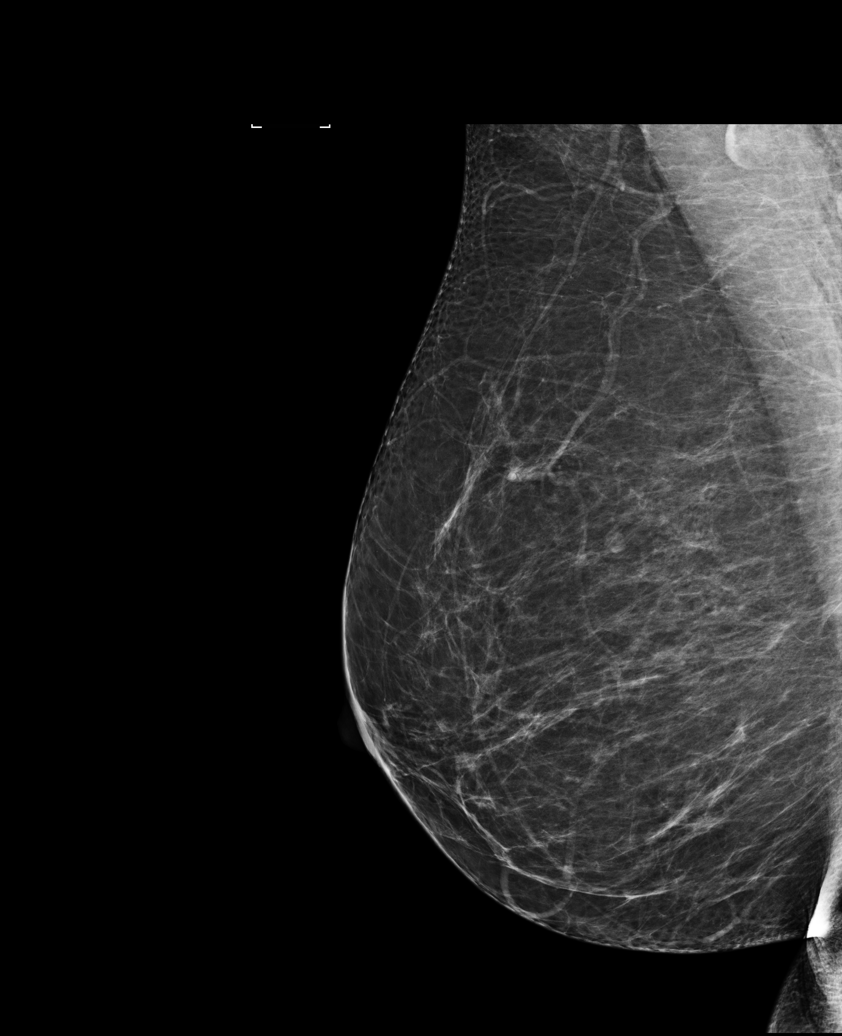

[L MLO]
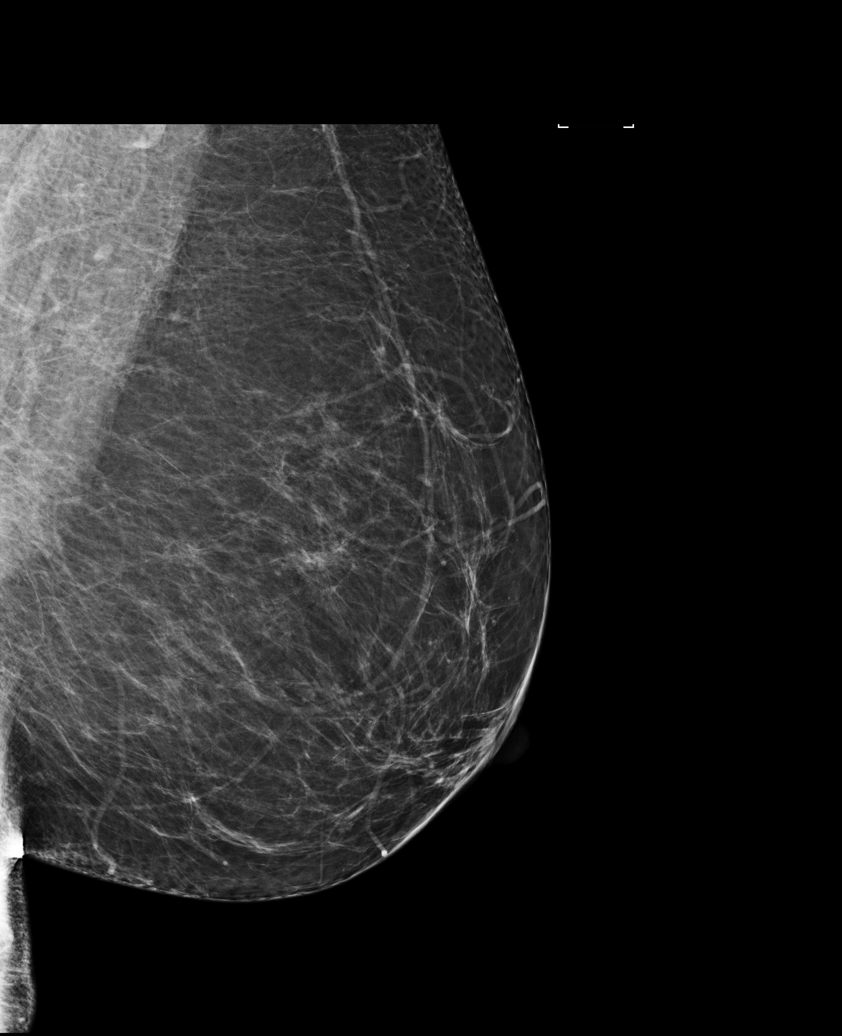

[L CC]
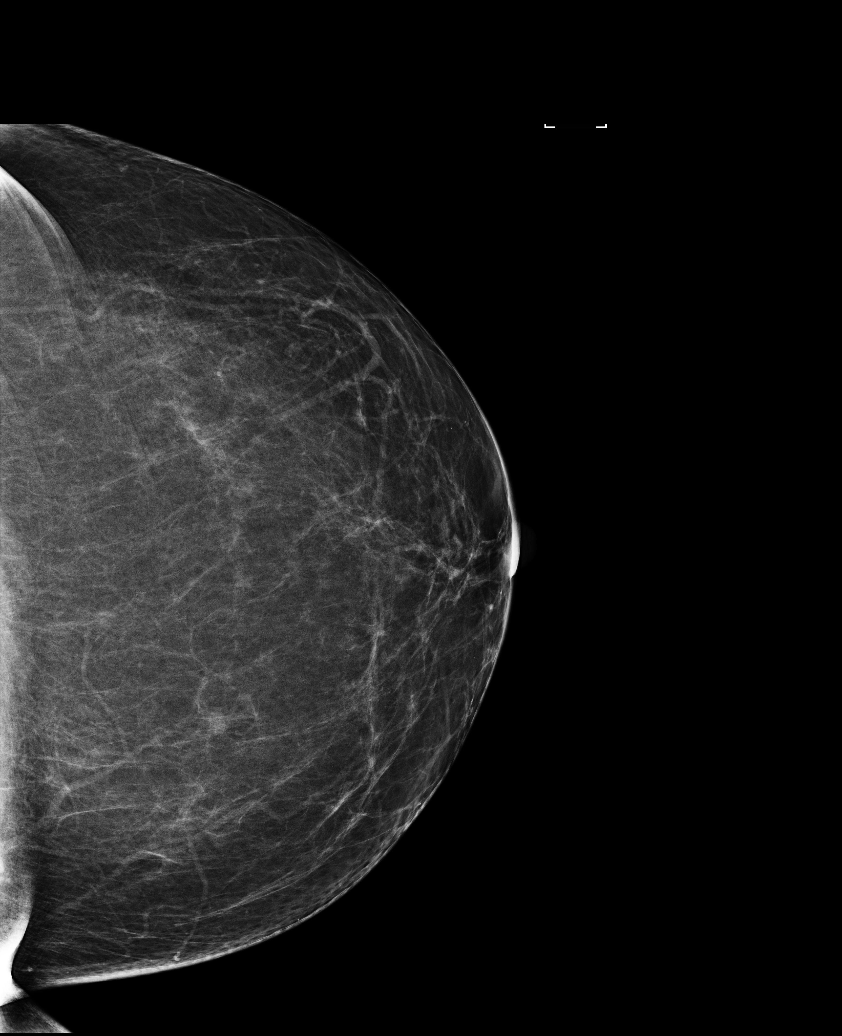

[R XCCL]
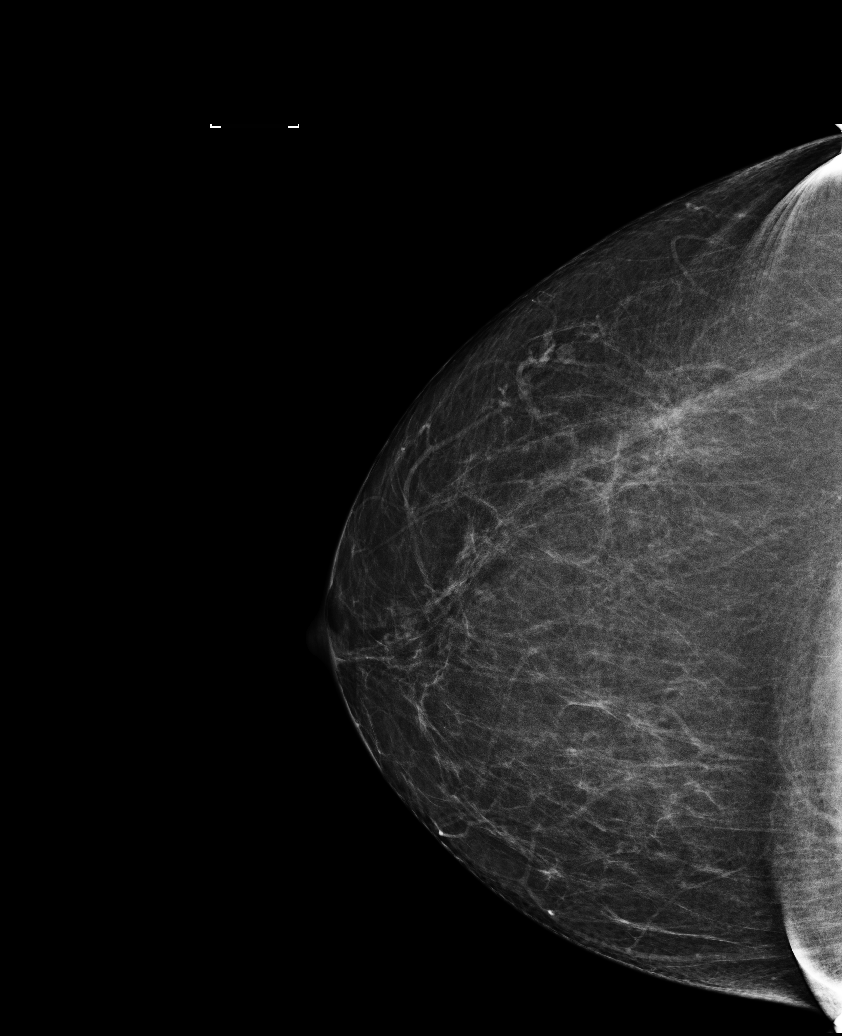

[R CC]
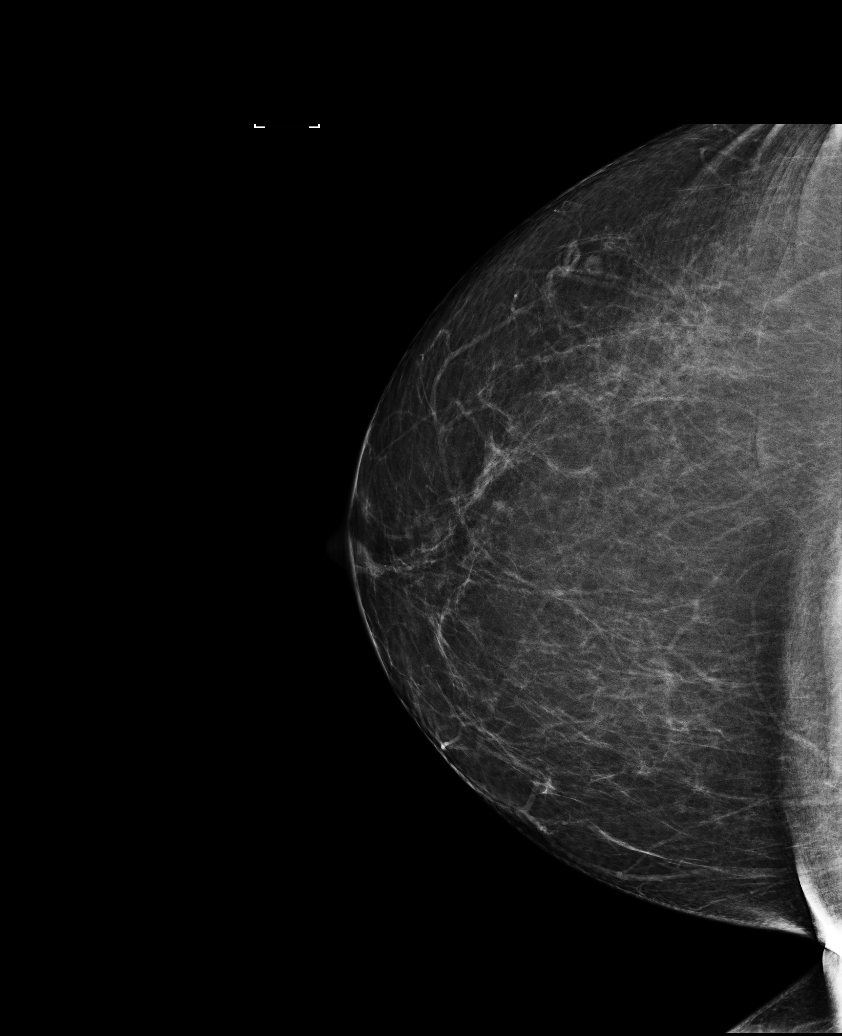

[6 of 6 positions shown; findings below may reference images not displayed]

ACR Breast Density Category b: There are scattered areas of
fibroglandular density.
FINDINGS: In the left breast, a possible asymmetry warrants further
evaluation. In the right breast, no findings suspicious for
malignancy. Images were processed with CAD.
IMPRESSION: Further evaluation is suggested for possible asymmetry in the left
breast.

RECOMMENDATION:
Diagnostic mammogram and possibly ultrasound of the left breast.
(Code:6F-[id number])

The patient will be contacted regarding the findings, and additional
imaging will be scheduled.

BI-RADS CATEGORY  0: Incomplete. Need additional imaging evaluation
and/or prior mammograms for comparison.

## 2018-12-07 ENCOUNTER — Other Ambulatory Visit: Payer: Self-pay | Admitting: Family Medicine

## 2018-12-11 ENCOUNTER — Other Ambulatory Visit: Payer: Self-pay | Admitting: Family Medicine

## 2018-12-11 DIAGNOSIS — L308 Other specified dermatitis: Secondary | ICD-10-CM | POA: Diagnosis not present

## 2019-01-06 ENCOUNTER — Other Ambulatory Visit: Payer: Self-pay | Admitting: Family

## 2019-02-05 ENCOUNTER — Other Ambulatory Visit: Payer: Self-pay | Admitting: Family

## 2019-02-05 NOTE — Telephone Encounter (Signed)
Refill sent to pharmacy- needs office visit. Please call pt to schedule. OK to offer virtual if she prefers.

## 2019-02-07 NOTE — Telephone Encounter (Signed)
Pt has been called twice  08/12 &  08/13 messages were left

## 2019-02-15 ENCOUNTER — Encounter: Payer: Self-pay | Admitting: Family

## 2019-03-01 ENCOUNTER — Encounter: Payer: Self-pay | Admitting: Family

## 2019-03-01 ENCOUNTER — Other Ambulatory Visit: Payer: Self-pay

## 2019-03-01 ENCOUNTER — Ambulatory Visit (INDEPENDENT_AMBULATORY_CARE_PROVIDER_SITE_OTHER): Payer: BC Managed Care – PPO | Admitting: Family

## 2019-03-01 VITALS — BP 141/82 | HR 68 | Temp 97.9°F | Resp 16 | Ht 67.0 in | Wt 226.0 lb

## 2019-03-01 DIAGNOSIS — L989 Disorder of the skin and subcutaneous tissue, unspecified: Secondary | ICD-10-CM

## 2019-03-01 DIAGNOSIS — I10 Essential (primary) hypertension: Secondary | ICD-10-CM | POA: Diagnosis not present

## 2019-03-01 DIAGNOSIS — R233 Spontaneous ecchymoses: Secondary | ICD-10-CM

## 2019-03-01 LAB — CBC WITH DIFFERENTIAL/PLATELET
Basophils Absolute: 0 10*3/uL (ref 0.0–0.1)
Basophils Relative: 0.6 % (ref 0.0–3.0)
Eosinophils Absolute: 0.3 10*3/uL (ref 0.0–0.7)
Eosinophils Relative: 3.5 % (ref 0.0–5.0)
HCT: 42.2 % (ref 36.0–46.0)
Hemoglobin: 14.3 g/dL (ref 12.0–15.0)
Lymphocytes Relative: 23.9 % (ref 12.0–46.0)
Lymphs Abs: 1.9 10*3/uL (ref 0.7–4.0)
MCHC: 34 g/dL (ref 30.0–36.0)
MCV: 90.7 fl (ref 78.0–100.0)
Monocytes Absolute: 0.8 10*3/uL (ref 0.1–1.0)
Monocytes Relative: 10.5 % (ref 3.0–12.0)
Neutro Abs: 4.9 10*3/uL (ref 1.4–7.7)
Neutrophils Relative %: 61.5 % (ref 43.0–77.0)
Platelets: 382 10*3/uL (ref 150.0–400.0)
RBC: 4.65 Mil/uL (ref 3.87–5.11)
RDW: 13 % (ref 11.5–15.5)
WBC: 7.9 10*3/uL (ref 4.0–10.5)

## 2019-03-01 LAB — BASIC METABOLIC PANEL
BUN: 11 mg/dL (ref 6–23)
CO2: 30 mEq/L (ref 19–32)
Calcium: 9 mg/dL (ref 8.4–10.5)
Chloride: 99 mEq/L (ref 96–112)
Creatinine, Ser: 0.86 mg/dL (ref 0.40–1.20)
GFR: 67.13 mL/min (ref >60.00)
Glucose, Bld: 92 mg/dL (ref 70–99)
Potassium: 3.9 mEq/L (ref 3.5–5.1)
Sodium: 136 mEq/L (ref 135–145)

## 2019-03-01 NOTE — Progress Notes (Signed)
Subjective:    Patient ID: Wendy Chambers, female    DOB: 08/26/1955, 63 y.o.   MRN: [id number]  HPI  Patient is a 63 year old female who presents today to discuss "red spots" on arm.  Patient reports that she noticed spontaneous red spots on her left forearm.  Denies known trauma.  Denies use of aspirin-containing products.  She denies associated tenderness or pain.  She also notes that she has multiple white dry spots on her arms and wonders what causes this.   Review of Systems Past Medical History:  Diagnosis Date  . Asthma   . Colon polyp 2010  . History of chicken pox    childhood  . History of migraine   . Hypertension      Social History   Socioeconomic History  . Marital status: Legally Separated    Spouse name: Not on file  . Number of children: Not on file  . Years of education: Not on file  . Highest education level: Not on file  Occupational History  . Not on file  Social Needs  . Financial resource strain: Not on file  . Food insecurity    Worry: Not on file    Inability: Not on file  . Transportation needs    Medical: Not on file    Non-medical: Not on file  Tobacco Use  . Smoking status: Former Research scientist (life sciences)  . Smokeless tobacco: Never Used  . Tobacco comment: Quit 1990 - 1 ppd  Substance and Sexual Activity  . Alcohol use: Yes    Alcohol/week: 0.0 standard drinks  . Drug use: No  . Sexual activity: Not on file  Lifestyle  . Physical activity    Days per week: Not on file    Minutes per session: Not on file  . Stress: Not on file  Relationships  . Social Herbalist on phone: Not on file    Gets together: Not on file    Attends religious service: Not on file    Active member of club or organization: Not on file    Attends meetings of clubs or organizations: Not on file    Relationship status: Not on file  . Intimate partner violence    Fear of current or ex partner: Not on file    Emotionally abused: Not on file    Physically  abused: Not on file    Forced sexual activity: Not on file  Other Topics Concern  . 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    Past Surgical History:  Procedure Laterality Date  . CESAREAN SECTION  2010    Family History  Problem Relation Age of Onset  . Heart disease Cousin        chf, on father's side, CAD, female  . Heart disease Cousin        chf, on father's side, female  . Lung cancer Father        died at 47  . Hyperlipidemia Father   . Hypertension Father   . Hyperlipidemia Mother   . Heart disease Paternal Grandmother   . Diabetes Neg Hx   . Heart attack Neg Hx     Allergies  Allergen Reactions  . Lisinopril     Other reaction(s): Dizziness (intolerance)  . Losartan Potassium  Other reaction(s): Cough (ALLERGY/intolerance)  . Tetracyclines & Related     Rash    Current Outpatient Medications on File Prior to Visit  Medication Sig Dispense Refill  . albuterol (VENTOLIN HFA) 108 (90 Base) MCG/ACT inhaler INHALE 2 PUFFS BY MOUTH INTO THE LUNGS EVERY 6 HOURS AS NEEDED FOR WHEEZING OR SHORTNESS OF BREATH 8 g 2  . amLODipine (NORVASC) 5 MG tablet TAKE ONE TABLET BY MOUTH ONE TIME DAILY 30 tablet 5  . Calcium Carbonate-Vitamin D (CALTRATE 600+D) 600-400 MG-UNIT tablet Take 1 tablet by mouth 2 (two) times daily.    . citalopram (CELEXA) 20 MG tablet TAKE ONE AND ONE-HALF TABLETS BY MOUTH ONE TIME DAILY 45 tablet 0  . hydrochlorothiazide (HYDRODIURIL) 25 MG tablet TAKE ONE TABLET BY MOUTH ONE TIME DAILY 90 tablet 1  . montelukast (SINGULAIR) 10 MG tablet Take 1 tablet (10 mg total) by mouth at bedtime. 30 tablet 3   No current facility-administered medications on file prior to visit.     BP (!) 141/82 (BP Location: Right Arm, Patient Position: Sitting, Cuff Size: Small)   Pulse 68   Temp 97.9 F (36.6 C) (Temporal)    Resp 16   Ht 5\' 7"  (1.702 m)   Wt 226 lb (102.5 kg)   SpO2 98%   BMI 35.40 kg/m       Objective:   Physical Exam Constitutional:      Appearance: She is well-developed.  Neck:     Musculoskeletal: Neck supple.     Thyroid: No thyromegaly.  Pulmonary:     Effort: Pulmonary effort is normal.  Skin:    General: Skin is warm and dry.     Comments: Cluster of petechia noted on left forearm  Multiple small dry patches noted on bilateral arms (?keratoses)  Neurological:     Mental Status: She is alert and oriented to person, place, and time.  Psychiatric:        Behavior: Behavior normal.        Thought Content: Thought content normal.        Judgment: Judgment normal.           Assessment & Plan:  Petechiae- will obtain CBC.  I suspect she had some minor trauma to the skin in this area which resulted in petechiae.  I have advised her let me know if she develops worsening or new petechiae in other areas.  Dry skin/skin lesions-will refer to dermatology for further evaluation and skin check.  Hypertension-blood pressure is acceptable.  We will continue current medications.  Obtain follow-up basic metabolic panel to assess renal function and electrolytes.

## 2019-03-01 NOTE — Patient Instructions (Signed)
Please complete lab work prior to leaving. Call if symptoms worsen. You should be contacted about your referral to dermatology.

## 2019-03-05 DIAGNOSIS — Z20828 Contact with and (suspected) exposure to other viral communicable diseases: Secondary | ICD-10-CM | POA: Diagnosis not present

## 2019-03-05 DIAGNOSIS — Z1159 Encounter for screening for other viral diseases: Secondary | ICD-10-CM | POA: Diagnosis not present

## 2019-03-12 ENCOUNTER — Other Ambulatory Visit: Payer: Self-pay | Admitting: Family

## 2019-04-02 ENCOUNTER — Telehealth: Payer: Self-pay

## 2019-04-02 NOTE — Telephone Encounter (Signed)
Copied from [place] [phone]. Topic: Quick Communication - See Telephone Encounter >> Apr 02, 2019  8:48 AM Loma [place] wrote: CRM for notification. See Telephone encounter for: 04/02/19.[place], [place] calling, This pt is wanting to participate in the [place] Pointer Study and a fax was sent for [name]'s review. The appt is tomorrow, wanting to know if all is set. Needs fax sent back signed as approved by [name] to [phone]. Or call her at [phone]

## 2019-04-02 NOTE — Telephone Encounter (Signed)
[name] reviewing form, will send when is done if ok to proceed.

## 2019-04-02 NOTE — Telephone Encounter (Signed)
Form wit approval faxed to [place] at fax number provided.

## 2019-04-06 ENCOUNTER — Other Ambulatory Visit: Payer: Self-pay | Admitting: Family

## 2019-04-09 ENCOUNTER — Encounter: Payer: BLUE CROSS/BLUE SHIELD | Admitting: Family

## 2019-06-26 ENCOUNTER — Other Ambulatory Visit: Payer: Self-pay | Admitting: Family

## 2019-06-26 DIAGNOSIS — Z1231 Encounter for screening mammogram for malignant neoplasm of breast: Secondary | ICD-10-CM

## 2019-07-05 ENCOUNTER — Ambulatory Visit (INDEPENDENT_AMBULATORY_CARE_PROVIDER_SITE_OTHER): Payer: BC Managed Care – PPO | Admitting: Family

## 2019-07-05 ENCOUNTER — Other Ambulatory Visit: Payer: Self-pay

## 2019-07-05 DIAGNOSIS — Z86010 Personal history of colonic polyps: Secondary | ICD-10-CM

## 2019-07-05 DIAGNOSIS — M542 Cervicalgia: Secondary | ICD-10-CM

## 2019-07-05 MED ORDER — MELOXICAM 7.5 MG PO TABS: 7.5000 mg | ORAL_TABLET | Freq: Every day | ORAL | 0 refills | Status: DC

## 2019-07-05 MED ORDER — METHOCARBAMOL 500 MG PO TABS: 500.0000 mg | ORAL_TABLET | Freq: Three times a day (TID) | ORAL | 0 refills | Status: DC | PRN

## 2019-07-05 NOTE — Progress Notes (Signed)
Virtual Visit via Video Note  I connected with Wendy Chambers [facility] on 07/05/19 at 10:20 AM EST by a video enabled telemedicine application and verified that I am speaking with the correct person using two identifiers.  Location: Patient: home Provider: work   I discussed the limitations of evaluation and management by telemedicine and the availability of in person appointments. The patient expressed understanding and agreed to proceed.  History of Present Illness:  Patient is a 64 yr old female who presents today with chief complaint of neck pain. Reports pain is in back of her neck.  Disrupting her sleep.  Denies associated numbness/weakness in her arms or legs.  No bowel/bladder incontinence. She has been taking aleve and advil prn with breif relief of her symptoms.   Hx of colon polyps- overdue for colo.  Last colo 2014 with [facility]  Past Medical History:  Diagnosis Date  . Asthma   . Colon polyp 2010  . History of chicken pox    childhood  . History of migraine   . Hypertension      Social History   Socioeconomic History  . Marital status: Legally Separated    Spouse name: Not on file  . Number of children: Not on file  . Years of education: Not on file  . Highest education level: Not on file  Occupational History  . Not on file  Tobacco Use  . Smoking status: Former Research scientist (life sciences)  . Smokeless tobacco: Never Used  . Tobacco comment: Quit 1990 - 1 ppd  Substance and Sexual Activity  . Alcohol use: Yes    Alcohol/week: 0.0 standard drinks  . Drug use: No  . Sexual activity: Not on file  Other Topics Concern  . 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   Social Determinants of Health   Financial Resource Strain:   . Difficulty of Paying Living Expenses: Not on file  Food Insecurity:   . Worried About Sales executive in the Last Year: Not on file  . Ran Out of Food in the Last Year: Not on file  Transportation Needs:   . Lack of Transportation (Medical): Not on file  . Lack of Transportation (Non-Medical): Not on file  Physical Activity:   . Days of Exercise per Week: Not on file  . Minutes of Exercise per Session: Not on file  Stress:   . Feeling of Stress : Not on file  Social Connections:   . Frequency of Communication with Friends and Family: Not on file  . Frequency of Social Gatherings with Friends and Family: Not on file  . Attends Religious Services: Not on file  . Active Member of Clubs or Organizations: Not on file  . Attends Archivist Meetings: Not on file  . Marital Status: Not on file  Intimate Partner Violence:   . Fear of Current or Ex-Partner: Not on file  . Emotionally Abused: Not on file  . Physically Abused: Not on file  . Sexually Abused: Not on file    Past Surgical History:  Procedure Laterality Date  . CESAREAN SECTION  2010    Family History  Problem Relation Age of Onset  . Heart disease Cousin        chf, on father's side, CAD, female  . Heart disease Cousin  chf, on father's side, female  . Lung cancer Father        died at 48  . Hyperlipidemia Father   . Hypertension Father   . Hyperlipidemia Mother   . Heart disease Paternal Grandmother   . Diabetes Neg Hx   . Heart attack Neg Hx     Allergies  Allergen Reactions  . Lisinopril     Other reaction(s): Dizziness (intolerance)  . Losartan Potassium     Other reaction(s): Cough (ALLERGY/intolerance)  . Tetracyclines & Related     Rash    Current Outpatient Medications on File Prior to Visit  Medication Sig Dispense Refill  . albuterol (VENTOLIN HFA) 108 (90 Base) MCG/ACT inhaler INHALE 2 PUFFS BY MOUTH INTO THE LUNGS EVERY 6 HOURS AS NEEDED FOR WHEEZING OR SHORTNESS OF BREATH 8 g 2  . amLODipine (NORVASC) 5 MG tablet TAKE ONE TABLET BY MOUTH ONE TIME DAILY 30 tablet 5  .  Calcium Carbonate-Vitamin D (CALTRATE 600+D) 600-400 MG-UNIT tablet Take 1 tablet by mouth 2 (two) times daily.    . citalopram (CELEXA) 20 MG tablet TAKE ONE AND ONE-HALF TABLETS BY MOUTH ONE TIME DAILY 45 tablet 5  . hydrochlorothiazide (HYDRODIURIL) 25 MG tablet TAKE ONE TABLET BY MOUTH ONE TIME DAILY 90 tablet 1  . montelukast (SINGULAIR) 10 MG tablet Take 1 tablet (10 mg total) by mouth at bedtime. 30 tablet 3   No current facility-administered medications on file prior to visit.    There were no vitals taken for this visit.   Observations/Objective:   Gen: Awake, alert, no acute distress Resp: Breathing is even and non-labored Psych: calm/pleasant demeanor Neuro: Alert and Oriented x 3, + facial symmetry, speech is clear.   Assessment and Plan:  Neck pain- trial of meloxicam and prn robaxin. Pt is advised to call if new symptoms, if symptoms worsen or if symptoms fail to improve.   Hx of colon polyps- overdue for follow up colo. Will place referral back to Dr. Alonza Bogus.  Follow Up Instructions:    I discussed the assessment and treatment plan with the patient. The patient was provided an opportunity to ask questions and all were answered. The patient agreed with the plan and demonstrated an understanding of the instructions.   The patient was advised to call back or seek an in-person evaluation if the symptoms worsen or if the condition fails to improve as anticipated.  Nance Pear, NP

## 2019-07-23 DIAGNOSIS — U071 COVID-19: Secondary | ICD-10-CM | POA: Diagnosis not present

## 2019-07-26 ENCOUNTER — Other Ambulatory Visit: Payer: Self-pay | Admitting: Family

## 2019-08-01 ENCOUNTER — Ambulatory Visit: Payer: BC Managed Care – PPO

## 2019-08-02 DIAGNOSIS — Z03818 Encounter for observation for suspected exposure to other biological agents ruled out: Secondary | ICD-10-CM | POA: Diagnosis not present

## 2019-08-02 DIAGNOSIS — Z20828 Contact with and (suspected) exposure to other viral communicable diseases: Secondary | ICD-10-CM | POA: Diagnosis not present

## 2019-09-04 ENCOUNTER — Other Ambulatory Visit: Payer: Self-pay

## 2019-09-04 ENCOUNTER — Ambulatory Visit
Admission: RE | Admit: 2019-09-04 | Discharge: 2019-09-04 | Disposition: A | Discharge status: Home / Self Care | Payer: BC Managed Care – PPO | Source: Ambulatory Visit | Attending: Family | Admitting: Family

## 2019-09-04 DIAGNOSIS — Z1231 Encounter for screening mammogram for malignant neoplasm of breast: Secondary | ICD-10-CM

## 2019-09-30 ENCOUNTER — Other Ambulatory Visit: Payer: Self-pay

## 2019-10-01 ENCOUNTER — Ambulatory Visit: Payer: BC Managed Care – PPO | Admitting: Internal Medicine

## 2019-10-01 ENCOUNTER — Encounter: Payer: Self-pay | Admitting: Internal Medicine

## 2019-10-01 ENCOUNTER — Other Ambulatory Visit: Payer: Self-pay

## 2019-10-01 VITALS — BP 145/81 | HR 75 | Temp 97.9°F | Resp 16 | Ht 67.0 in | Wt 228.1 lb

## 2019-10-01 DIAGNOSIS — Z0189 Encounter for other specified special examinations: Secondary | ICD-10-CM

## 2019-10-01 DIAGNOSIS — R5383 Other fatigue: Secondary | ICD-10-CM | POA: Diagnosis not present

## 2019-10-01 LAB — CBC WITH DIFFERENTIAL/PLATELET
Basophils Absolute: 0.1 10*3/uL (ref 0.0–0.1)
Basophils Relative: 0.8 % (ref 0.0–3.0)
Eosinophils Absolute: 0.4 10*3/uL (ref 0.0–0.7)
Eosinophils Relative: 4 % (ref 0.0–5.0)
HCT: 43.4 % (ref 36.0–46.0)
Hemoglobin: 15.1 g/dL — ABNORMAL HIGH (ref 12.0–15.0)
Lymphocytes Relative: 23.9 % (ref 12.0–46.0)
Lymphs Abs: 2.4 10*3/uL (ref 0.7–4.0)
MCHC: 34.7 g/dL (ref 30.0–36.0)
MCV: 89.9 fl (ref 78.0–100.0)
Monocytes Absolute: 0.9 10*3/uL (ref 0.1–1.0)
Monocytes Relative: 9.2 % (ref 3.0–12.0)
Neutro Abs: 6.2 10*3/uL (ref 1.4–7.7)
Neutrophils Relative %: 62.1 % (ref 43.0–77.0)
Platelets: 402 10*3/uL — ABNORMAL HIGH (ref 150.0–400.0)
RBC: 4.83 Mil/uL (ref 3.87–5.11)
RDW: 13.3 % (ref 11.5–15.5)
WBC: 10 10*3/uL (ref 4.0–10.5)

## 2019-10-01 LAB — BASIC METABOLIC PANEL
BUN: 14 mg/dL (ref 6–23)
CO2: 29 mEq/L (ref 19–32)
Calcium: 9.3 mg/dL (ref 8.4–10.5)
Chloride: 99 mEq/L (ref 96–112)
Creatinine, Ser: 0.76 mg/dL (ref 0.40–1.20)
GFR: 77.27 mL/min (ref >60.00)
Glucose, Bld: 85 mg/dL (ref 70–99)
Potassium: 3.5 mEq/L (ref 3.5–5.1)
Sodium: 138 mEq/L (ref 135–145)

## 2019-10-01 LAB — TSH: TSH: 2.45 u[IU]/mL (ref 0.35–4.50)

## 2019-10-01 NOTE — Patient Instructions (Signed)
We are referring you to neurology, they should be calling you within few days.  GO TO THE LAB : Get the blood work     Horace, please reschedule your appointments Come back for  a checkup with [name] in 3 to 4 months

## 2019-10-01 NOTE — Progress Notes (Signed)
Pre visit review using our clinic review tool, if applicable. No additional management support is needed unless otherwise documented below in the visit note. 

## 2019-10-01 NOTE — Progress Notes (Signed)
Subjective:    Patient ID: Wendy Chambers, female    DOB: 26-Apr-1956, 64 y.o.   MRN: [id number]  DOS:  10/01/2019 Type of visit - description: Acute 1 year history of fatigue, she feels exhausted. Symptoms increasing in the last few weeks.  She admits to going to bed early and sleep a good amount of hours but  when she wakes up this is still tired. She is part of a research study, they did a sleep study and she was diagnosed with "mild sleep apnea".   Review of Systems Admits to significant weight gain lately. No fever chills No chest pain or difficulty breathing.  No edema or DOE Very rarely has nausea.  No vomiting, diarrhea or blood in the stools. She takes citalopram, mood is okay. He has noted some difficulty concentrating at work and occasional headache.  Past Medical History:  Diagnosis Date  . Asthma   . Colon polyp 2010  . History of chicken pox    childhood  . History of migraine   . Hypertension     Past Surgical History:  Procedure Laterality Date  . CESAREAN SECTION  2010    Allergies as of 10/01/2019      Reactions   Lisinopril    Other reaction(s): Dizziness (intolerance)   Losartan Potassium    Other reaction(s): Cough (ALLERGY/intolerance)   Tetracyclines & Related    Rash      Medication List       Accurate as of October 01, 2019  1:54 PM. If you have any questions, ask your nurse or doctor.        albuterol 108 (90 Base) MCG/ACT inhaler Commonly known as: Ventolin HFA INHALE 2 PUFFS BY MOUTH INTO THE LUNGS EVERY 6 HOURS AS NEEDED FOR WHEEZING OR SHORTNESS OF BREATH   amLODipine 5 MG tablet Commonly known as: NORVASC TAKE ONE TABLET BY MOUTH ONE TIME DAILY   Calcium Carbonate-Vitamin D 600-400 MG-UNIT tablet Commonly known as: Caltrate 600+D Take 1 tablet by mouth 2 (two) times daily.   citalopram 20 MG tablet Commonly known as: CELEXA TAKE ONE AND ONE-HALF TABLETS BY MOUTH ONE TIME DAILY   hydrochlorothiazide 25 MG tablet  Commonly known as: HYDRODIURIL TAKE ONE TABLET BY MOUTH ONE TIME DAILY   meloxicam 7.5 MG tablet Commonly known as: MOBIC Take 1 tablet (7.5 mg total) by mouth daily.   methocarbamol 500 MG tablet Commonly known as: Robaxin Take 1 tablet (500 mg total) by mouth every 8 (eight) hours as needed for muscle spasms.   montelukast 10 MG tablet Commonly known as: SINGULAIR Take 1 tablet (10 mg total) by mouth at bedtime.          Objective:   Physical Exam BP (!) 145/81 (BP Location: Left Arm, Patient Position: Sitting, Cuff Size: Normal)   Pulse 75   Temp 97.9 F (36.6 C) (Temporal)   Resp 16   Ht 5\' 7"  (1.702 m)   Wt 228 lb 2 oz (103.5 kg)   SpO2 97%   BMI 35.73 kg/m  General:   Well developed, NAD, BMI noted.  HEENT:  Normocephalic . Face symmetric, atraumatic.  Throat slightly crowded, not red. Not pale. Neck: No thyromegaly Lungs:  CTA B Normal respiratory effort, no intercostal retractions, no accessory muscle use. Heart: RRR,  no murmur.  Abdomen:  Not distended, soft, non-tender. No rebound or rigidity.   Skin: Not pale. Not jaundice Lower extremities: no pretibial edema bilaterally  Neurologic:  alert &  oriented X3.  Speech normal, gait appropriate for age and unassisted Psych--  Cognition and judgment appear intact.  Cooperative with normal attention span and concentration.  Behavior appropriate. No anxious or depressed appearing.     Assessment      64 year old female, PMH includes HTN, osteopenia, depression anxiety, presents with:  Severe fatigue: The patient reports severe fatigue despite sleeping several hours. This is going for him for a year, increased for few weeks. She has gained weight lately.  Also had a + sleep study for sleep apnea as a part of a research project. Epworth scale : 12 PHQ-9 : 13 Suspect she has moderate to severe sleep apnea.  Mild headaches, undercontrolled depression also probably related to OSA. Refer to neurology.  For completeness we will get a BMP, CBC and TSH Recommend to RTC in 4 months to see PCP     This visit occurred during the SARS-CoV-2 public health emergency.  Safety protocols were in place, including screening questions prior to the visit, additional usage of staff PPE, and extensive cleaning of exam room while observing appropriate contact time as indicated for disinfecting solutions.

## 2019-10-14 ENCOUNTER — Other Ambulatory Visit: Payer: Self-pay

## 2019-10-14 ENCOUNTER — Encounter: Payer: Self-pay | Admitting: Neurology

## 2019-10-14 ENCOUNTER — Ambulatory Visit (INDEPENDENT_AMBULATORY_CARE_PROVIDER_SITE_OTHER): Payer: BC Managed Care – PPO | Admitting: Neurology

## 2019-10-14 VITALS — BP 140/84 | HR 62 | Temp 97.0°F | Ht 67.0 in | Wt 228.3 lb

## 2019-10-14 DIAGNOSIS — G4719 Other hypersomnia: Secondary | ICD-10-CM | POA: Diagnosis not present

## 2019-10-14 DIAGNOSIS — G4733 Obstructive sleep apnea (adult) (pediatric): Secondary | ICD-10-CM

## 2019-10-14 DIAGNOSIS — Z820 Family history of epilepsy and other diseases of the nervous system: Secondary | ICD-10-CM | POA: Diagnosis not present

## 2019-10-14 DIAGNOSIS — E669 Obesity, unspecified: Secondary | ICD-10-CM

## 2019-10-14 DIAGNOSIS — R519 Headache, unspecified: Secondary | ICD-10-CM

## 2019-10-14 NOTE — Progress Notes (Signed)
Subjective:    Patient ID: Wendy Chambers is a 64 y.o. female.  HPI     Wendy Age, MD, PhD [facility] [address] [place], [place] [address]  Dear Dr. Larose Kells,   I saw your patient, Wendy Chambers, upon your kind request in my sleep clinic today for initial consultation of her sleep disorder, in particular, concern for underlying obstructive sleep apnea.  The patient is unaccompanied today.  As you know, Wendy Chambers is a 64 year old right-handed woman with an underlying medical history of migraines, asthma, hypertension, and obesity, who reports snoring and excessive daytime somnolence.  She had a positive screening for sleep apnea, as part of a research study through [facility]. She brought a copy of the home sleep test from October 2020 indicating moderate obstructive sleep apnea with an AHI of 15.4/h, O2 nadir of 74%.  She reports snoring which is noticed by her son who lives with her, he is 60 years old.  She has an older son as well.  She is divorced, works in Engineer, technical sales.  She has to be at work at 36 and rise time is around 6, bedtime around 9.  She has 2 dogs in the household, they do not sleep on the bed with her.  She does not watch TV in the bedroom.  She does not have a family history of sleep apnea.  She does not have night to night nocturia but has woken up with a sense of gasping or with a headache at times. I reviewed your office note from 10/01/2019.  Her Epworth sleepiness score is 10 out of 24 today.  She drinks caffeine in the form of coffee, about 3 cups in the morning and some green tea later.  Her weight has gradually increased over time.  She is a non-smoker and drinks alcohol a few times during the week and sometimes on the weekends.  She reports a family history of memory loss and she took part in a study through [facility] to explore effects of lifestyle on memory function.  As part of the study she had a home sleep test.  Her Past  Medical History Is Significant For: Past Medical History:  Diagnosis Date  . Asthma   . Colon polyp 2010  . History of chicken pox    childhood  . History of migraine   . Hypertension     Her Past Surgical History Is Significant For: Past Surgical History:  Procedure Laterality Date  . CESAREAN SECTION  2010    Her Family History Is Significant For: Family History  Problem Relation Chambers of Onset  . Heart disease Cousin        chf, on father's side, CAD, female  . Heart disease Cousin        chf, on father's side, female  . Lung cancer Father        died at 39  . Hyperlipidemia Father   . Hypertension Father   . Hyperlipidemia Mother   . Heart disease Paternal Grandmother   . Diabetes Neg Hx   . Heart attack Neg Hx     Her Social History Is Significant For: Social History   Socioeconomic History  . Marital status: Legally Separated    Spouse name: Not on file  . Number of children: Not on file  . Years of education: Not on file  . Highest education level: Not on file  Occupational History  . Occupation: works  Tobacco Use  . Smoking status: Former Smoker    Quit date: 1990    Years since quitting: 31.3  . Smokeless tobacco: Never Used  . Tobacco comment: Quit 1990 - 1 ppd  Substance and Sexual Activity  . Alcohol use: Yes    Alcohol/week: 0.0 standard drinks    Comment: social  . Drug use: No  . Sexual activity: Not on file  Other Topics Concern  . 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   Social Determinants of Health   Financial Resource Strain:   . Difficulty of Paying Living Expenses:   Food Insecurity:   . Worried About Charity fundraiser in the Last Year:   . Arboriculturist in the Last Year:   Transportation Needs:   . Film/video editor (Medical):   Marland Kitchen Lack of Transportation (Non-Medical):    Physical Activity:   . Days of Exercise per Week:   . Minutes of Exercise per Session:   Stress:   . Feeling of Stress :   Social Connections:   . Frequency of Communication with Friends and Family:   . Frequency of Social Gatherings with Friends and Family:   . Attends Religious Services:   . Active Member of Clubs or Organizations:   . Attends Archivist Meetings:   Marland Kitchen Marital Status:     Her Allergies Are:  Allergies  Allergen Reactions  . Lisinopril     Other reaction(s): Dizziness (intolerance)  . Losartan Potassium     Other reaction(s): Cough (ALLERGY/intolerance)  . Tetracyclines & Related     Rash  :   Her Current Medications Are:  Outpatient Encounter Medications as of 10/14/2019  Medication Sig  . albuterol (VENTOLIN HFA) 108 (90 Base) MCG/ACT inhaler INHALE 2 PUFFS BY MOUTH INTO THE LUNGS EVERY 6 HOURS AS NEEDED FOR WHEEZING OR SHORTNESS OF BREATH  . amLODipine (NORVASC) 5 MG tablet TAKE ONE TABLET BY MOUTH ONE TIME DAILY  . citalopram (CELEXA) 20 MG tablet TAKE ONE AND ONE-HALF TABLETS BY MOUTH ONE TIME DAILY  . hydrochlorothiazide (HYDRODIURIL) 25 MG tablet TAKE ONE TABLET BY MOUTH ONE TIME DAILY  . [DISCONTINUED] Calcium Carbonate-Vitamin D (CALTRATE 600+D) 600-400 MG-UNIT tablet Take 1 tablet by mouth 2 (two) times daily.  . [DISCONTINUED] meloxicam (MOBIC) 7.5 MG tablet Take 1 tablet (7.5 mg total) by mouth daily.  . [DISCONTINUED] methocarbamol (ROBAXIN) 500 MG tablet Take 1 tablet (500 mg total) by mouth every 8 (eight) hours as needed for muscle spasms.  . [DISCONTINUED] montelukast (SINGULAIR) 10 MG tablet Take 1 tablet (10 mg total) by mouth at bedtime.   No facility-administered encounter medications on file as of 10/14/2019.  :  Review of Systems:  Out of a complete 14 point review of systems, all are reviewed and negative with the exception of these symptoms as listed below: Review of Systems  Neurological:       Sleep consult. Pt is part  of a clinical trial had had a sleep study completed in March of 2021. PCP referred for consult on osa.   Epworth Sleepiness Scale 0= would never doze 1= slight chance of dozing 2= moderate chance of dozing 3= high chance of dozing  Sitting and reading:2 Watching TV:1 Sitting inactive in a public place (ex. Theater or meeting):1 As a passenger  in a car for an hour without a break:2 Lying down to rest in the afternoon:3 Sitting and talking to someone:0 Sitting quietly after lunch (no alcohol):1 In a car, while stopped in traffic:0 Total:10     Objective:  Neurological Exam  Physical Exam Physical Examination:   Vitals:   10/14/19 1541  BP: 140/84  Pulse: 62  Temp: (!) 97 F (36.1 C)    General Examination: The patient is a very pleasant 64 y.o. female in no acute distress. She appears well-developed and well-nourished and well groomed.   HEENT: Normocephalic, atraumatic, pupils are equal, round and reactive to light, extraocular tracking is good without limitation to gaze excursion or nystagmus noted. Hearing is grossly intact. Face is symmetric with normal facial animation. Speech is clear with no dysarthria noted. There is no hypophonia. There is no lip, neck/head, jaw or voice tremor. Neck is supple with full range of passive and active motion. There are no carotid bruits on auscultation. Oropharynx exam reveals: mild mouth dryness, adequate dental hygiene and moderate airway crowding, due to tonsillar size of 1-2+ bilaterally, longer uvula noted, Mallampati class II, neck circumference of 16 and three-quarter inches.  She has a mild overbite.  Tongue protrudes centrally and palate elevates symmetrically.  Chest: Clear to auscultation without wheezing, rhonchi or crackles noted.  Heart: S1+S2+0, regular and normal without murmurs, rubs or gallops noted.   Abdomen: Soft, non-tender and non-distended with normal bowel sounds appreciated on auscultation.  Extremities: There  is no pitting edema in the distal lower extremities bilaterally.   Skin: Warm and dry without trophic changes noted.   Musculoskeletal: exam reveals no obvious joint deformities, tenderness or joint swelling or erythema.   Neurologically:  Mental status: The patient is awake, alert and oriented in all 4 spheres. Her immediate and remote memory, attention, language skills and fund of knowledge are appropriate. There is no evidence of aphasia, agnosia, apraxia or anomia. Speech is clear with normal prosody and enunciation. Thought process is linear. Mood is normal and affect is normal.  Cranial nerves II - XII are as described above under HEENT exam.  Motor exam: Normal bulk, strength and tone is noted. There is no tremor, Romberg is negative. Fine motor skills and coordination: grossly intact.  Cerebellar testing: No dysmetria or intention tremor. There is no truncal or gait ataxia.  Sensory exam: intact to light touch in the upper and lower extremities.  Gait, station and balance: She stands easily. No veering to one side is noted. No leaning to one side is noted. Posture is Chambers-appropriate and stance is narrow based. Gait shows normal stride length and normal pace. No problems turning are noted. Tandem walk is unremarkable.                Assessment and plan:   In summary, Mataya Hauge is a very pleasant 64 y.o.-year old female with an underlying medical history of migraines, asthma, hypertension, and obesity, whose history and physical exam as well as home sleep test as part of research evaluation are in keeping with obstructive sleep apnea (OSA). I had a long chat with the patient about my findings and the diagnosis of OSA, its prognosis and treatment options. We talked about medical treatments, surgical interventions and non-pharmacological approaches. I explained in particular the risks and ramifications of untreated moderate to severe OSA, especially with respect to developing  cardiovascular disease down the Road, including congestive heart failure, difficult to treat hypertension, cardiac arrhythmias, or stroke. Even  type 2 diabetes has, in part, been linked to untreated OSA. Symptoms of untreated OSA include daytime sleepiness, memory problems, mood irritability and mood disorder such as depression and anxiety, lack of energy, as well as recurrent headaches, especially morning headaches. We talked about trying to maintain a healthy lifestyle in general, as well as the importance of weight control. We also talked about the importance of good sleep hygiene. I recommended the following at this time: sleep study.  I explained the sleep test procedure to the patient and also outlined possible surgical and non-surgical treatment options of OSA, including the use of a custom-made dental device (which would require a referral to a specialist dentist or oral surgeon), upper airway surgical options, such as traditional UPPP or a novel less invasive surgical option in the form of Inspire hypoglossal nerve stimulation (which would involve a referral to an ENT surgeon). I also explained the CPAP treatment option to the patient, who indicated that she would be willing to try CPAP if the need arises. I explained the importance of being compliant with PAP treatment, not only for insurance purposes but primarily to improve Her symptoms, and for the patient's long term health benefit, including to reduce Her cardiovascular risks. I answered all her questions today and the patient was in agreement. I plan to see her back after the sleep study is completed and encouraged her to call with any interim questions, concerns, problems or updates.   Thank you very much for allowing me to participate in the care of this nice patient. If I can be of any further assistance to you please do not hesitate to call me at [phone].  Sincerely,   Wendy Age, MD, PhD

## 2019-10-14 NOTE — Patient Instructions (Addendum)
Thank you for choosing [facility] for your sleep related care! It was nice to meet you today! I appreciate that you entrust me with your sleep related healthcare concerns. I hope, I was able to address at least some of your concerns today, and that I can help you feel reassured and also get better.    Here is what we discussed today and what we came up with as our plan for you:    Based on your symptoms, recent sleep test through research participation and your exam, you have obstructive sleep apnea (aka OSA), and I think we should proceed with a sleep study to determine whether you do or do not have OSA and how severe it is. Even, if you have mild OSA, I may want you to consider treatment with CPAP, as treatment of even borderline or mild sleep apnea can result and improvement of symptoms such as sleep disruption, daytime sleepiness, nighttime bathroom breaks, restless leg symptoms, improvement of headache syndromes, even improved mood disorder.   Please remember, the long-term risks and ramifications of untreated moderate to severe obstructive sleep apnea are: increased Cardiovascular disease, including congestive heart failure, stroke, difficult to control hypertension, treatment resistant obesity, arrhythmias, especially irregular heartbeat commonly known as A. Fib. (atrial fibrillation); even type 2 diabetes has been linked to untreated OSA.   Sleep apnea can cause disruption of sleep and sleep deprivation in most cases, which, in turn, can cause recurrent headaches, problems with memory, mood, concentration, focus, and vigilance. Most people with untreated sleep apnea report excessive daytime sleepiness, which can affect their ability to drive. Please do not drive if you feel sleepy. Patients with sleep apnea developed difficulty initiating and maintaining sleep (aka insomnia).   Having sleep apnea may increase your risk for other sleep disorders, including involuntary behaviors  sleep such as sleep terrors, sleep talking, sleepwalking.    Having sleep apnea can also increase your risk for restless leg syndrome and leg movements at night.   Please note that untreated obstructive sleep apnea may carry additional perioperative morbidity. Patients with significant obstructive sleep apnea (typically, in the moderate to severe degree) should receive, if possible, perioperative PAP (positive airway pressure) therapy and the surgeons and particularly the anesthesiologists should be informed of the diagnosis and the severity of the sleep disordered breathing.   I will likely see you back after your sleep study to go over the test results and where to go from there. We will call you after your sleep study to advise about the results (most likely, you will hear from [place], my nurse) and to set up an appointment at the time, as necessary.    Our sleep lab administrative assistant will call you to schedule your sleep study and give you further instructions, regarding the check in process for the sleep study, arrival time, what to bring, when you can expect to leave after the study, etc., and to answer any other logistical questions you may have. If you don't hear back from her by about 2 weeks from now, please feel free to call her direct line at [phone] or you can call our general clinic number, or email [place] through My Chart.

## 2019-10-24 DIAGNOSIS — L308 Other specified dermatitis: Secondary | ICD-10-CM | POA: Diagnosis not present

## 2019-10-24 DIAGNOSIS — L282 Other prurigo: Secondary | ICD-10-CM | POA: Diagnosis not present

## 2019-11-01 DIAGNOSIS — H52223 Regular astigmatism, bilateral: Secondary | ICD-10-CM | POA: Diagnosis not present

## 2019-11-01 DIAGNOSIS — H5212 Myopia, left eye: Secondary | ICD-10-CM | POA: Diagnosis not present

## 2019-11-01 DIAGNOSIS — H524 Presbyopia: Secondary | ICD-10-CM | POA: Diagnosis not present

## 2019-11-01 DIAGNOSIS — H5201 Hypermetropia, right eye: Secondary | ICD-10-CM | POA: Diagnosis not present

## 2019-11-05 ENCOUNTER — Other Ambulatory Visit: Payer: Self-pay | Admitting: Family

## 2019-11-13 ENCOUNTER — Other Ambulatory Visit: Payer: Self-pay

## 2019-11-13 ENCOUNTER — Ambulatory Visit (INDEPENDENT_AMBULATORY_CARE_PROVIDER_SITE_OTHER): Payer: BC Managed Care – PPO | Admitting: Neurology

## 2019-11-13 DIAGNOSIS — Z820 Family history of epilepsy and other diseases of the nervous system: Secondary | ICD-10-CM

## 2019-11-13 DIAGNOSIS — E669 Obesity, unspecified: Secondary | ICD-10-CM

## 2019-11-13 DIAGNOSIS — G4719 Other hypersomnia: Secondary | ICD-10-CM

## 2019-11-13 DIAGNOSIS — G4733 Obstructive sleep apnea (adult) (pediatric): Secondary | ICD-10-CM

## 2019-11-13 DIAGNOSIS — R519 Headache, unspecified: Secondary | ICD-10-CM

## 2019-11-19 NOTE — Addendum Note (Signed)
Addended by: Star Age on: 11/19/2019 08:16 AM   Modules accepted: Orders

## 2019-11-19 NOTE — Progress Notes (Signed)
Patient referred by Dr. Larose Kells, seen by me on 10/14/19, HST on 11/13/19.    Please call and notify the patient that the recent home sleep test showed obstructive sleep apnea in the severe range. While I recommend treatment for this in the form CPAP, her insurance will not approve a sleep study for this. They will likely only approve a trial of autoPAP, which means, that we don't have to bring her in for a sleep study with CPAP, but will let start using a so called autoPAP machine at home, through a DME company (of her choice, or as per insurance requirement). The DME representative will educate her on how to use the machine, how to put the mask on, etc. I have placed an order in the chart. Please send referral, talk to patient, send report to referring MD. We will need a FU in sleep clinic for 10 weeks post-PAP set up, please arrange that with me or one of our NPs. Thanks,   Star Age, MD, PhD [facility])

## 2019-11-19 NOTE — Procedures (Signed)
Patient Information     First Name: Wendy Kelp Last Name: Chambers ID: [id number]  Birth Date: [birth year] Age: 64 Gender: Female  Referring Provider: Debbrah Alar, NP BMI: 36.0 (W=229 lb, H=5' 7'')  Neck Circ.:  17 '' Epworth:  10/24   Sleep Study Information    Study Date: Nov 13, 2019 S/H/A Version: 001.001.001.001 / 4.1.1528 / 72  History:    64 year old woman with a history of migraines, asthma, hypertension, and obesity, who reports snoring and excessive daytime somnolence. She had a positive screening for sleep apnea, as part of a research study through [facility]  Summary & Diagnosis:     Severe OSA Recommendations:     This home sleep test demonstrates severe obstructive sleep apnea with a total AHI of 44.5/hour and O2 nadir of 68%. Treatment with positive airway pressure (in the form of CPAP) is recommended. This will require a full night CPAP titration study for proper treatment settings, O2 monitoring and mask fitting. Based on the severity of the sleep disordered breathing an attended titration study is indicated. However, patient's insurance has denied an attended sleep study; therefore, the patient will be advised to proceed with an autoPAP titration/trial at home for now. Please note that untreated obstructive sleep apnea may carry additional perioperative morbidity. Patients with significant obstructive sleep apnea should receive perioperative PAP therapy and the surgeons and particularly the anesthesiologist should be informed of the diagnosis and the severity of the sleep disordered breathing. The patient should be cautioned not to drive, work at heights, or operate dangerous or heavy equipment when tired or sleepy. Review and reiteration of good sleep hygiene measures should be pursued with any patient. Other causes of the patient's symptoms, including circadian rhythm disturbances, an underlying mood disorder, medication effect and/or an underlying medical problem cannot be ruled  out based on this test. Clinical correlation is recommended. The patient and her referring provider will be notified of the test results. The patient will be seen in follow up in sleep clinic at [facility].  I certify that I have reviewed the raw data recording prior to the issuance of this report in accordance with the standards of the American Academy of Sleep Medicine (AASM).  Star Age, MD, PhD [facility]) Diplomat, ABPN (Neurology and Sleep)             Sleep Summary  Oxygen Saturation Statistics   Start Study Time: End Study Time: Total Recording Time:  9:36:34 PM 6:02:21 AM 8 h, 25 min  Total Sleep Time % REM of Sleep Time:  7 h, 47 min 28.6    Mean: 92 Minimum: 68 Maximum: 98  Mean of Desaturations Nadirs (%):   89  Oxygen Desaturation. %:   4-9 10-20 >20 Total  Events Number Total   142  28 82.6 16.3  2 1.2  172 100.0  Oxygen Saturation: <90 <=88 <85 <80 <70  Duration (minutes): Sleep % 22.8 4.9 18.9 9.5 4.0 2.0 2.7 0.6 0.1 0.0     Respiratory Indices      Total Events REM NREM All Night  pRDI:  313  pAHI:  308 ODI:  172  pAHIc:  11  % CSR: 0.0 60.3 59.7 44.0 1.8 40.5 39.7 18.8 1.5 45.2 44.5 24.8 1.6       Pulse Rate Statistics during Sleep (BPM)      Mean:  63 Minimum: 47  Maximum: 93    Indices are calculated using technically valid sleep time of  6 h, 55 min. pRDI/pAHI are calculated using oxi desaturations ? 3%  Body Position Statistics  Position Supine Prone Right Left Non-Supine  Sleep (min) 293.8 169.0 2.5 2.0 173.5  Sleep % 62.9 36.2 0.5 0.4 37.1  pRDI 35.1 60.0 N/A N/A 60.4  pAHI 34.2 59.6 N/A N/A 60.0  ODI 16.1 37.2 N/A N/A 37.9     Snoring Statistics Snoring Level (dB) >40 >50 >60 >70 >80 >Threshold (45)  Sleep (min) 298.3 24.8 0.2 0.0 0.0 106.2  Sleep % 63.8 5.3 0.0 0.0 0.0 22.7    Mean: 43 dB Sleep Stages Chart                                                                    pAHI=44.5                                                                                 Mild              Moderate                    Severe                                                 5              15                    30

## 2019-11-21 ENCOUNTER — Telehealth: Payer: Self-pay

## 2019-11-21 NOTE — Telephone Encounter (Signed)
-----   Message from Star Age, MD sent at 11/19/2019  8:16 AM EDT ----- Patient referred by Dr. Larose Kells, seen by me on 10/14/19, HST on 11/13/19.    Please call and notify the patient that the recent home sleep test showed obstructive sleep apnea in the severe range. While I recommend treatment for this in the form CPAP, her insurance will not approve a sleep study for this. They will likely only approve a trial of autoPAP, which means, that we don't have to bring her in for a sleep study with CPAP, but will let start using a so called autoPAP machine at home, through a DME company (of her choice, or as per insurance requirement). The DME representative will educate her on how to use the machine, how to put the mask on, etc. I have placed an order in the chart. Please send referral, talk to patient, send report to referring MD. We will need a FU in sleep clinic for 10 weeks post-PAP set up, please arrange that with me or one of our NPs. Thanks,   Star Age, MD, PhD [facility])

## 2019-11-21 NOTE — Telephone Encounter (Signed)
I called pt. I advised pt that Dr. Rexene Alberts reviewed their sleep study results and found that pt has severe osa. Dr. Rexene Alberts recommends that pt start a autopap for home therapy. I reviewed PAP compliance expectations with the pt. Pt is agreeable to starting an auto-PAP. I advised pt that an order will be sent to a DME, [facility], and they will call the pt within about one week after they file with the pt's insurance. [facility] will show the pt how to use the machine, fit for masks, and troubleshoot the auto-PAP if needed. Pt had no questions at this time but was encouraged to call back if questions arise. I have sent the order to [facility] and have received confirmation that they have received the order.

## 2019-12-10 DIAGNOSIS — G4733 Obstructive sleep apnea (adult) (pediatric): Secondary | ICD-10-CM | POA: Diagnosis not present

## 2019-12-17 ENCOUNTER — Ambulatory Visit: Payer: BC Managed Care – PPO | Admitting: Family

## 2019-12-17 ENCOUNTER — Encounter: Payer: Self-pay | Admitting: Family

## 2019-12-17 ENCOUNTER — Other Ambulatory Visit: Payer: Self-pay

## 2019-12-17 VITALS — BP 137/76 | HR 52 | Temp 97.5°F | Resp 16 | Ht 67.0 in | Wt 226.0 lb

## 2019-12-17 DIAGNOSIS — G5603 Carpal tunnel syndrome, bilateral upper limbs: Secondary | ICD-10-CM | POA: Diagnosis not present

## 2019-12-17 DIAGNOSIS — M545 Low back pain, unspecified: Secondary | ICD-10-CM

## 2019-12-17 LAB — POC URINALSYSI DIPSTICK (AUTOMATED)
Bilirubin, UA: NEGATIVE
Glucose, UA: NEGATIVE
Leukocytes, UA: NEGATIVE
Nitrite, UA: NEGATIVE
Protein, UA: NEGATIVE
Spec Grav, UA: 1.01 (ref 1.010–1.025)
Urobilinogen, UA: 0.2 E.U./dL
pH, UA: 7 (ref 5.0–8.0)

## 2019-12-17 MED ORDER — METHYLPREDNISOLONE 4 MG PO TBPK: ORAL_TABLET | ORAL | 0 refills | Status: DC

## 2019-12-17 MED ORDER — METHOCARBAMOL 500 MG PO TABS: 500.0000 mg | ORAL_TABLET | Freq: Three times a day (TID) | ORAL | 0 refills | Status: DC | PRN

## 2019-12-17 NOTE — Patient Instructions (Addendum)
Begin medrol dose pak (steroid)  and as needed robaxin (muscle relaxer).   Acute Back Pain, Adult Acute back pain is sudden and usually short-lived. It is often caused by an injury to the muscles and tissues in the back. The injury may result from:  A muscle or ligament getting overstretched or torn (strained). Ligaments are tissues that connect bones to each other. Lifting something improperly can cause a back strain.  Wear and tear (degeneration) of the spinal disks. Spinal disks are circular tissue that provides cushioning between the bones of the spine (vertebrae).  Twisting motions, such as while playing sports or doing yard work.  A hit to the back.  Arthritis. You may have a physical exam, lab tests, and imaging tests to find the cause of your pain. Acute back pain usually goes away with rest and home care. Follow these instructions at home: Managing pain, stiffness, and swelling  Take over-the-counter and prescription medicines only as told by your health care provider.  Your health care provider may recommend applying ice during the first 24-48 hours after your pain starts. To do this: ? Put ice in a plastic bag. ? Place a towel between your skin and the bag. ? Leave the ice on for 20 minutes, 2-3 times a day.  If directed, apply heat to the affected area as often as told by your health care provider. Use the heat source that your health care provider recommends, such as a moist heat pack or a heating pad. ? Place a towel between your skin and the heat source. ? Leave the heat on for 20-30 minutes. ? Remove the heat if your skin turns bright red. This is especially important if you are unable to feel pain, heat, or cold. You have a greater risk of getting burned. Activity   Do not stay in bed. Staying in bed for more than 1-2 days can delay your recovery.  Sit up and stand up straight. Avoid leaning forward when you sit, or hunching over when you stand. ? If you work at a  desk, sit close to it so you do not need to lean over. Keep your chin tucked in. Keep your neck drawn back, and keep your elbows bent at a right angle. Your arms should look like the letter "L." ? Sit high and close to the steering wheel when you drive. Add lower back (lumbar) support to your car seat, if needed.  Take short walks on even surfaces as soon as you are able. Try to increase the length of time you walk each day.  Do not sit, drive, or stand in one place for more than 30 minutes at a time. Sitting or standing for long periods of time can put stress on your back.  Do not drive or use heavy machinery while taking prescription pain medicine.  Use proper lifting techniques. When you bend and lift, use positions that put less stress on your back: ? Quinby your knees. ? Keep the load close to your body. ? Avoid twisting.  Exercise regularly as told by your health care provider. Exercising helps your back heal faster and helps prevent back injuries by keeping muscles strong and flexible.  Work with a physical therapist to make a safe exercise program, as recommended by your health care provider. Do any exercises as told by your physical therapist. Lifestyle  Maintain a healthy weight. Extra weight puts stress on your back and makes it difficult to have good posture.  Avoid activities  or situations that make you feel anxious or stressed. Stress and anxiety increase muscle tension and can make back pain worse. Learn ways to manage anxiety and stress, such as through exercise. General instructions  Sleep on a firm mattress in a comfortable position. Try lying on your side with your knees slightly bent. If you lie on your back, put a pillow under your knees.  Follow your treatment plan as told by your health care provider. This may include: ? Cognitive or behavioral therapy. ? Acupuncture or massage therapy. ? Meditation or yoga. Contact a health care provider if:  You have pain that  is not relieved with rest or medicine.  You have increasing pain going down into your legs or buttocks.  Your pain does not improve after 2 weeks.  You have pain at night.  You lose weight without trying.  You have a fever or chills. Get help right away if:  You develop new bowel or bladder control problems.  You have unusual weakness or numbness in your arms or legs.  You develop nausea or vomiting.  You develop abdominal pain.  You feel faint. Summary  Acute back pain is sudden and usually short-lived.  Use proper lifting techniques. When you bend and lift, use positions that put less stress on your back.  Take over-the-counter and prescription medicines and apply heat or ice as directed by your health care provider. This information is not intended to replace advice given to you by your health care provider. Make sure you discuss any questions you have with your health care provider. Document Revised: 10/02/2018 Document Reviewed: 01/25/2017 Elsevier Patient Education  Rev[employer].

## 2019-12-17 NOTE — Progress Notes (Signed)
Subjective:    Patient ID: Wendy Chambers, female    DOB: 01-31-1956, 64 y.o.   MRN: [id number]  HPI  Patient is a 64 yr old female who presents today with chief complaint of low back pain.   Reports pain across the lower back (aching). Started Friday. Reports that she woke up frequently over the weekend due to the discomfort.  She has been using Aleve with only minimal improvement.  No known injury. Back pain is non-radiating.    Having intermittent right and left hand numbness.   Review of Systems See HPI  Past Medical History:  Diagnosis Date  . Asthma   . Colon polyp 2010  . History of chicken pox    childhood  . History of migraine   . Hypertension      Social History   Socioeconomic History  . Marital status: Legally Separated    Spouse name: Not on file  . Number of children: Not on file  . Years of education: Not on file  . Highest education level: Not on file  Occupational History  . Occupation: works  Tobacco Use  . Smoking status: Former Smoker    Quit date: 1990    Years since quitting: 31.4  . Smokeless tobacco: Never Used  . Tobacco comment: Quit 1990 - 1 ppd  Substance and Sexual Activity  . Alcohol use: Yes    Alcohol/week: 0.0 standard drinks    Comment: social  . Drug use: No  . Sexual activity: Not on file  Other Topics Concern  . 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   Social Determinants of Health   Financial Resource Strain:   . Difficulty of Paying Living Expenses:   Food Insecurity:   . Worried About Charity fundraiser in the Last Year:   . Arboriculturist in the Last Year:   Transportation Needs:   . Film/video editor (Medical):   Marland Kitchen Lack of Transportation (Non-Medical):   Physical Activity:   . Days of Exercise per Week:   . Minutes of Exercise per Session:     Stress:   . Feeling of Stress :   Social Connections:   . Frequency of Communication with Friends and Family:   . Frequency of Social Gatherings with Friends and Family:   . Attends Religious Services:   . Active Member of Clubs or Organizations:   . Attends Archivist Meetings:   Marland Kitchen Marital Status:   Intimate Partner Violence:   . Fear of Current or Ex-Partner:   . Emotionally Abused:   Marland Kitchen Physically Abused:   . Sexually Abused:     Past Surgical History:  Procedure Laterality Date  . CESAREAN SECTION  2010    Family History  Problem Relation Age of Onset  . Heart disease Cousin        chf, on father's side, CAD, female  . Heart disease Cousin        chf, on father's side, female  . Lung cancer Father        died at 73  . Hyperlipidemia Father   . Hypertension Father   . Hyperlipidemia Mother   . Heart disease Paternal Grandmother   . Diabetes Neg Hx   . Heart attack Neg Hx     Allergies  Allergen Reactions  . Lisinopril     Other reaction(s): Dizziness (intolerance)  . Losartan Potassium     Other reaction(s): Cough (ALLERGY/intolerance)  . Tetracyclines & Related     Rash    Current Outpatient Medications on File Prior to Visit  Medication Sig Dispense Refill  . albuterol (VENTOLIN HFA) 108 (90 Base) MCG/ACT inhaler INHALE 2 PUFFS BY MOUTH INTO THE LUNGS EVERY 6 HOURS AS NEEDED FOR WHEEZING OR SHORTNESS OF BREATH 8 g 2  . amLODipine (NORVASC) 5 MG tablet TAKE ONE TABLET BY MOUTH ONE TIME DAILY 30 tablet 5  . citalopram (CELEXA) 20 MG tablet TAKE ONE AND ONE-HALF TABLETS BY MOUTH ONE TIME DAILY 45 tablet 5  . hydrochlorothiazide (HYDRODIURIL) 25 MG tablet TAKE ONE TABLET BY MOUTH ONE TIME DAILY 90 tablet 1   No current facility-administered medications on file prior to visit.    BP 137/76 (BP Location: Right Arm, Patient Position: Sitting, Cuff Size: Small)   Pulse (!) 52   Temp (!) 97.5 F (36.4 C) (Temporal)   Resp 16   Ht 5\' 7"  (1.702 m)    Wt 226 lb (102.5 kg)   SpO2 99%   BMI 35.40 kg/m       Objective:   Physical Exam Constitutional:      Appearance: She is well-developed.  Neck:     Thyroid: No thyromegaly.  Cardiovascular:     Rate and Rhythm: Normal rate and regular rhythm.     Heart sounds: Normal heart sounds. No murmur heard.   Pulmonary:     Effort: Pulmonary effort is normal. No respiratory distress.     Breath sounds: Normal breath sounds. No wheezing.  Musculoskeletal:     Cervical back: Neck supple.     Comments: No tenderness to palpation overlying the thoracic or lumbar spine.  Skin:    General: Skin is warm and dry.  Neurological:     Mental Status: She is alert and oriented to person, place, and time.     Comments: Negative Tinel's test Positive Phalen's test Bilateral lower extremity strength is 5 out of 5 2+ bilateral patellar reflexes  Psychiatric:        Behavior: Behavior normal.        Thought Content: Thought content normal.        Judgment: Judgment normal.           Assessment & Plan:  Acute low back pain-has not responded to over-the-counter NSAIDs.  She is having trouble sitting at a desk due to discomfort.  Will initiate a Medrol Dosepak as well as Robaxin as needed.  I have written her out of work for the next 3 days.  She is advised on red flags that she prompt ER visit including new bowel or bladder incontinence or weakness of the lower extremities.  Carpal tunnel syndrome-we discussed wrist braces to wear at night.  The Medrol Dosepak will also likely help her symptoms.  This visit occurred during the SARS-CoV-2 public health emergency.  Safety protocols were in place, including screening questions prior to the visit, additional usage of staff PPE, and extensive cleaning of exam room while observing appropriate contact time as indicated for disinfecting solutions.

## 2019-12-18 ENCOUNTER — Encounter: Payer: Self-pay | Admitting: Family

## 2019-12-18 MED ORDER — TRAMADOL HCL 50 MG PO TABS: 50.0000 mg | ORAL_TABLET | Freq: Three times a day (TID) | ORAL | 0 refills | Status: AC | PRN

## 2020-01-17 ENCOUNTER — Encounter: Payer: Self-pay | Admitting: Family

## 2020-01-17 DIAGNOSIS — M545 Low back pain, unspecified: Secondary | ICD-10-CM

## 2020-01-20 ENCOUNTER — Other Ambulatory Visit: Payer: Self-pay

## 2020-01-20 ENCOUNTER — Ambulatory Visit (HOSPITAL_BASED_OUTPATIENT_CLINIC_OR_DEPARTMENT_OTHER)
Admission: RE | Admit: 2020-01-20 | Discharge: 2020-01-20 | Disposition: A | Discharge status: Home / Self Care | Payer: BC Managed Care – PPO | Source: Ambulatory Visit | Attending: Family | Admitting: Family

## 2020-01-20 DIAGNOSIS — M545 Low back pain, unspecified: Secondary | ICD-10-CM

## 2020-01-23 ENCOUNTER — Ambulatory Visit: Payer: BC Managed Care – PPO | Admitting: Family

## 2020-02-03 ENCOUNTER — Ambulatory Visit: Payer: Self-pay | Admitting: Neurology

## 2020-02-03 ENCOUNTER — Other Ambulatory Visit: Payer: Self-pay | Admitting: Family

## 2020-02-11 ENCOUNTER — Telehealth (INDEPENDENT_AMBULATORY_CARE_PROVIDER_SITE_OTHER): Payer: BC Managed Care – PPO | Admitting: Family

## 2020-02-11 ENCOUNTER — Encounter: Payer: Self-pay | Admitting: Family

## 2020-02-11 VITALS — BP 152/84 | HR 70

## 2020-02-11 DIAGNOSIS — M5442 Lumbago with sciatica, left side: Secondary | ICD-10-CM | POA: Diagnosis not present

## 2020-02-11 DIAGNOSIS — F418 Other specified anxiety disorders: Secondary | ICD-10-CM

## 2020-02-11 DIAGNOSIS — J45909 Unspecified asthma, uncomplicated: Secondary | ICD-10-CM

## 2020-02-11 DIAGNOSIS — G8929 Other chronic pain: Secondary | ICD-10-CM

## 2020-02-11 DIAGNOSIS — I10 Essential (primary) hypertension: Secondary | ICD-10-CM | POA: Diagnosis not present

## 2020-02-11 MED ORDER — MELOXICAM 7.5 MG PO TABS
7.5000 mg | ORAL_TABLET | Freq: Every day | ORAL | 0 refills | Status: DC
Start: 2020-02-11 — End: 2020-08-25

## 2020-02-11 NOTE — Progress Notes (Signed)
Virtual Visit via Video Note  I connected with Wendy Chambers [facility] on 02/11/20 at  4:00 PM EDT by a video enabled telemedicine application and verified that I am speaking with the correct person using two identifiers.  Location: Patient: home Provider: work   I discussed the limitations of evaluation and management by telemedicine and the availability of in person appointments. The patient expressed understanding and agreed to proceed. Only the patient and myself were present for today's video call.   History of Present Illness:  Patient is a 64 yr old female who presents today for follow up.   HTN- maintained on hctz 25mg  and amlodipine 5mg .   BP Readings from Last 3 Encounters:  02/11/20 (!) 152/84  12/17/19 137/76  10/14/19 140/84   Asthma- has albuterol on hand for prn use. Reports no recent need for inhaler.   Depression/anxiety- has not taken citalopram in several months. Reports mood is good.  Wt Readings from Last 3 Encounters:  12/17/19 226 lb (102.5 kg)  10/14/19 228 lb 5 oz (103.6 kg)  10/01/19 228 lb 2 oz (103.5 kg)   Back pain- reports that she continues to have pain in her lower back. Sometimes radiates down the left leg and "my leg gives out on me." Had lumbar x-ray performed which noted Multilevel spondylosis, most pronounced at L4-5 mildly worsened since 2015.   She quit her job due to inability to sit for long periods of time.  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     Observations/Objective:   Gen: Awake, alert, no acute distress Resp: Breathing is even and non-labored Psych: calm/pleasant demeanor Neuro: Alert and Oriented x 3, + facial symmetry, speech is clear.   Assessment and Plan:  Low back pain- rx with meloxicam and refer to PT.  Follow up 6-8 weeks. If symptoms worsen or fail to improve plan MRI.  HTN- bp mildly elevated. Advised pt to check bp once daily for next 3-4 days and send me her readings via  mychart.  Depression/anxiety- currently stable off of medication. Monitor.  Asthma- stable. Has albuterol for prn use.  Follow Up Instructions:    I discussed the assessment and treatment plan with the patient. The patient was provided an opportunity to ask questions and all were answered. The patient agreed with the plan and demonstrated an understanding of the instructions.   The patient was advised to call back or seek an in-person evaluation if the symptoms worsen or if the condition fails to improve as anticipated.  Nance Pear, NP

## 2020-02-15 ENCOUNTER — Encounter: Payer: Self-pay | Admitting: Family

## 2020-02-25 ENCOUNTER — Other Ambulatory Visit: Payer: Self-pay

## 2020-02-25 ENCOUNTER — Encounter: Payer: Self-pay | Admitting: Physical Therapy

## 2020-02-25 ENCOUNTER — Ambulatory Visit: Payer: BC Managed Care – PPO | Attending: Family | Admitting: Physical Therapy

## 2020-02-25 DIAGNOSIS — G8929 Other chronic pain: Secondary | ICD-10-CM | POA: Diagnosis not present

## 2020-02-25 DIAGNOSIS — M5442 Lumbago with sciatica, left side: Secondary | ICD-10-CM | POA: Insufficient documentation

## 2020-02-25 DIAGNOSIS — R29898 Other symptoms and signs involving the musculoskeletal system: Secondary | ICD-10-CM | POA: Insufficient documentation

## 2020-02-25 NOTE — Therapy (Signed)
[facility] [address]  [place], [place], [address] Phone: [phone]   Fax:  [phone]  Physical Therapy Evaluation  Patient Details  Name: Wendy Chambers MRN: [id number] Date of Birth: [birth year] Referring Provider (PT): Debbrah Alar, NP   Encounter Date: 02/25/2020   PT End of Session - 02/25/20 1149    Visit Number 1    Number of Visits 7    Date for PT Re-Evaluation 04/07/20    Authorization Type COBRA    PT Start Time 1104    PT Stop Time 1139    PT Time Calculation (min) 35 min    Activity Tolerance Patient tolerated treatment well    Behavior During Therapy [facility] for tasks assessed/performed           Past Medical History:  Diagnosis Date  . Asthma   . Colon polyp 2010  . History of chicken pox    childhood  . History of migraine   . Hypertension     Past Surgical History:  Procedure Laterality Date  . CESAREAN SECTION  2010    There were no vitals filed for this visit.    Subjective Assessment - 02/25/20 1105    Subjective Patient reports LBP with progressive worsening over the past few months. Does note a hx of LBP and notes that her recent "bone density test showed worsening arthritis." Notes intermittent L LE buckling when turning but denies falls. Also reports N/T and paresthesias over the L lateral calf and top of foot for the past couple of weeks. Denies B&B changes. Pain occurs over the L LB with radiation into the L buttock to just below the knee. Worse with prolonged sitting or standing and notes difficulty cleaning the tub or vacuuming. Better with movement and able to mow the lawn without problems.    Pertinent History HTN, migraine, asthma    Limitations Sitting;Lifting;Standing;House hold activities    How long can you sit comfortably? 2 hours    How long can you stand comfortably? 2-3 hours    How long can you walk comfortably? unlimited    Diagnostic tests  01/20/20 lumbar xray: Multilevel spondylosis, most pronounced at L4-5. This is mildly worsened since 2015.  Levocurvature of the lumbar spine, similar in comparison to prior.    Patient Stated Goals "find ways to function normally"    Currently in Pain? Yes    Pain Score 1     Pain Location Back    Pain Orientation Left    Pain Descriptors / Indicators Aching    Pain Type Chronic pain              [facility] PT Assessment - 02/25/20 1111      Assessment   Medical Diagnosis Chronic Midline LBP with L sided sciatica    Referring Provider (PT) Debbrah Alar, NP    Onset Date/Surgical Date 11/25/19    Next MD Visit pt unsure    Prior Therapy yes- several years ago      Precautions   Precautions None      Balance Screen   Has the patient fallen in the past 6 months No    Has the patient had a decrease in activity level because of a fear of falling?  No    Is the patient reluctant to leave their home because of a fear of falling?  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    Leisure gardening      Cognition   Overall Cognitive Status Within Functional Limits for tasks assessed      Sensation   Light Touch Appears Intact      Coordination   Gross Motor Movements are Fluid and Coordinated Yes      Posture/Postural Control   Posture/Postural Control Postural limitations    Postural Limitations Rounded Shoulders;Forward head      ROM / Strength   AROM / PROM / Strength AROM;Strength      AROM   AROM Assessment Site Lumbar    Lumbar Flexion floor   c/o cavitation but without pain   Lumbar  Extension mildly limited    Lumbar - Right Side Bend jt line    Lumbar - Left Side Bend jt line    Lumbar - Right Rotation mildly limited    Lumbar - Left Rotation WNL      Strength   Strength Assessment Site Hip;Knee;Ankle    Right/Left Hip Right;Left    Right Hip Flexion 4+/5    Right Hip ABduction 4+/5    Right Hip ADduction 4+/5    Left Hip Flexion 4/5    Left Hip ABduction 4+/5    Left Hip ADduction 4+/5    Right/Left Knee Right;Left    Right Knee Flexion 5/5    Right Knee Extension 5/5    Left Knee Flexion 5/5    Left Knee Extension 4/5    Right/Left Ankle Right;Left    Right Ankle Dorsiflexion 5/5    Right Ankle Plantar Flexion 5/5   20 reps   Left Ankle Dorsiflexion 5/5    Left Ankle Plantar Flexion 5/5   20 reps; pain in buttock towards last reps     Flexibility   Soft Tissue Assessment /Muscle Length yes    Hamstrings B WNL    Quadriceps B WNL    Piriformis B WNL; pain in R groin with KTOS      Palpation   Spinal mobility no notable hypomobility with central PAs; TTP over T11    Palpation comment no TTP along midline of spine, LB musculature or buttocks; increased soft tissue restriction in B lumbar paraspinals      Ambulation/Gait   Gait Pattern Step-through pattern;Within Functional Limits    Ambulation Surface Level;Indoor    Gait velocity WNL                      Objective measurements completed on examination: See above findings.               PT Education - 02/25/20 1148    Education Details prognosis, POC, HEP, edu on patient's position of comfort (lumbar extension) and exaccerbating position (lumbar flexion) and motions/activities to avoid when beginning personal training    Person(s) Educated Patient    Methods Explanation;Demonstration;Tactile cues;Verbal cues;Handout    Comprehension Verbalized understanding            PT Short Term Goals - 02/25/20 1156  PT SHORT TERM GOAL #1   Title Patient to be independent  with initial HEP.    Time 3    Period Weeks    Status New    Target Date 03/17/20             PT Long Term Goals - 02/25/20 1157      PT LONG TERM GOAL #1   Title Patient to be independent with advanced HEP.    Time 6    Period Weeks    Status New    Target Date 04/07/20      PT LONG TERM GOAL #2   Title Patient to demonstrate atleast 4+/5 strength in L hip flexor and quad.    Time 6    Period Weeks    Status New    Target Date 04/07/20      PT LONG TERM GOAL #3   Title Patient to demonstrate lumbar AROM WFL and without pain limiting.    Time 6    Period Weeks    Status New    Target Date 04/07/20      PT LONG TERM GOAL #4   Title Patient to report 70% improvement in LBP with household chores.    Time 6    Period Weeks    Status New    Target Date 04/07/20      PT LONG TERM GOAL #5   Title Patient to report tolerance for a full shift at work without pain limiting.    Time 6    Period Weeks    Status New    Target Date 04/07/20                  Plan - 02/25/20 1150    Clinical Impression Statement Patient is a 64y/o F presenting to OPPT with c/o progressive worsening of LBP over the past couple of months. Pain is located over L LB with intermittent radiation of pain into the L buttock just below the knee. Notes intermittent L LE buckling but denies falls. Also reports N/T and paresthesias over the L lateral calf and top of foot for the past couple of weeks. Pain worse with prolonged sitting or standing and notes difficulty cleaning the tub or vacuuming. Patient seeming to demonstrate a slight comfort bias towards lumbar extension, with pain being more exacerbated by lumbar flexion. Patient today presenting with rounded shoulders and forward head posture, mildly limited lumbar AROM, marked L hip flexor and quadriceps weakness, and increased soft tissue restriction in B lumbar paraspinals. Patient was educated on gentle stretching and hip strengthening HEP-  patient reported understanding. Would benefit from skilled PT services 1x/week for 6 weeks to address aforementioned impairments.    Personal Factors and Comorbidities Age;Comorbidity 3+;Fitness;Past/Current Experience;Profession;Time since onset of injury/illness/exacerbation    Comorbidities HTN, migraine, asthma    Examination-Activity Limitations Sit;Bend;Squat;Stairs;Stand;Carry;Lift    Examination-Participation Restrictions Church;Cleaning;Community Activity;Shop;Driving;Yard Work;Laundry;Meal Prep;Occupation    Stability/Clinical Decision Making Stable/Uncomplicated    Clinical Decision Making Low    Rehab Potential Good    PT Frequency 1x / week    PT Duration 6 weeks    PT Treatment/Interventions ADLs/Self Care Home Management;Cryotherapy;Electrical Stimulation;Moist Heat;Traction;Balance training;Therapeutic exercise;Therapeutic activities;Functional mobility training;Stair training;Gait training;Ultrasound;Neuromuscular re-education;Patient/family education;Manual techniques;Taping;Energy conservation;Dry needling;Passive range of motion    PT Next Visit Plan lumbar FOTO; reassess HEP; progress L hip flexor and quad strengthening and lumbopelvic ROM    Consulted and Agree with Plan of Care Patient  Patient will benefit from skilled therapeutic intervention in order to improve the following deficits and impairments:  Decreased activity tolerance, Decreased strength, Pain, Increased fascial restricitons, Decreased balance, Decreased range of motion, Improper body mechanics, Postural dysfunction, Impaired flexibility  Visit Diagnosis: Chronic left-sided low back pain with left-sided sciatica  Other symptoms and signs involving the musculoskeletal system     Problem List Patient Active Problem List   Diagnosis Date Noted  . Left foot pain 05/26/2015  . Osteopenia 05/06/2015  . Achilles tendonitis, bilateral 05/05/2015  . Metatarsalgia of left foot 05/05/2015  .  Pronation deformity of ankle, acquired 05/05/2015  . Flu-like symptoms 08/22/2014  . Piriformis syndrome 02/20/2014  . Low back pain with radiation 02/05/2014  . Routine general medical examination at a health care facility 09/09/2013  . Neck pain 08/28/2013  . Shoulder pain 06/18/2013  . Neck mass 01/23/2013  . Eczema of external ear 01/23/2013  . Depression with anxiety 04/21/2012  . HTN (hypertension) 09/06/2011  . Chronic cough 09/06/2011  . Colon polyps 09/06/2011     Janene Harvey, PT, DPT 02/25/20 12:05 PM   [facility] [address]  [place], [place], [address] Phone: [phone]   Fax:  [phone]  Name: Makeisha Jentsch MRN: [id number] Date of Birth: 1956-02-21

## 2020-02-28 ENCOUNTER — Encounter: Payer: Self-pay | Admitting: Family

## 2020-02-28 MED ORDER — NITROFURANTOIN MONOHYD MACRO 100 MG PO CAPS
100.0000 mg | ORAL_CAPSULE | Freq: Two times a day (BID) | ORAL | 0 refills | Status: DC
Start: 2020-02-28 — End: 2020-04-21

## 2020-03-04 ENCOUNTER — Ambulatory Visit: Payer: BC Managed Care – PPO | Attending: Family | Admitting: Physical Therapy

## 2020-03-04 ENCOUNTER — Other Ambulatory Visit: Payer: Self-pay

## 2020-03-04 ENCOUNTER — Encounter: Payer: Self-pay | Admitting: Physical Therapy

## 2020-03-04 DIAGNOSIS — R29898 Other symptoms and signs involving the musculoskeletal system: Secondary | ICD-10-CM | POA: Diagnosis not present

## 2020-03-04 DIAGNOSIS — G8929 Other chronic pain: Secondary | ICD-10-CM | POA: Diagnosis not present

## 2020-03-04 DIAGNOSIS — M5442 Lumbago with sciatica, left side: Secondary | ICD-10-CM | POA: Insufficient documentation

## 2020-03-04 NOTE — Therapy (Addendum)
[facility] [address]  [place], [place], [address] Phone: [phone]   Fax:  [phone]  Physical Therapy Treatment  Patient Details  Name: Wendy Chambers MRN: [id number] Date of Birth: 01-May-1956 Referring Provider (PT): Debbrah Alar, NP   Encounter Date: 03/04/2020   PT End of Session - 03/04/20 1055    Visit Number 2    Number of Visits 7    Date for PT Re-Evaluation 04/07/20    Authorization Type COBRA    PT Start Time 1011    PT Stop Time 1054    PT Time Calculation (min) 43 min    Activity Tolerance Patient tolerated treatment well    Behavior During Therapy [facility] for tasks assessed/performed           Past Medical History:  Diagnosis Date  . Asthma   . Colon polyp 2010  . History of chicken pox    childhood  . History of migraine   . Hypertension     Past Surgical History:  Procedure Laterality Date  . CESAREAN SECTION  2010    There were no vitals filed for this visit.   Subjective Assessment - 03/04/20 1012    Subjective Had been working on her HEP until she had an increase in N/T in her L LE on Friday, at which point she discontinued them.    Pertinent History HTN, migraine, asthma    Diagnostic tests 01/20/20 lumbar xray: Multilevel spondylosis, most pronounced at L4-5. This is mildly worsened since 2015.  Levocurvature of the lumbar spine, similar in comparison to prior.    Patient Stated Goals "find ways to function normally"    Currently in Pain? No/denies    Multiple Pain Sites Yes              [facility] PT Assessment - 03/04/20 0001      Observation/Other Assessments   Focus on Therapeutic Outcomes (FOTO)  lumbar: 48 (52% limited, 42& predicted)                         [facility] Adult PT Treatment/Exercise - 03/04/20 0001      Exercises   Exercises Lumbar      Lumbar Exercises: Aerobic   Nustep L4 x 6 min (LEs only)      Lumbar Exercises:  Standing   Other Standing Lumbar Exercises sidestepping with red loop around ankles 4x 82ft    Other Standing Lumbar Exercises alt resisted marching with red TB 2x10   cues to stand up tall and avoid anterior trunk lean     Lumbar Exercises: Seated   Other Seated Lumbar Exercises prayer stretch with green pball forward 5x5", R/L diagonals 5x5"   c/o more tightness on L side     Lumbar Exercises: Supine   Dead Bug 10 reps    Dead Bug Limitations slight difficulty maintaining neutral spine with L LE movement    Bridge 10 reps    Bridge Limitations good form & tolerance   c/o mild discomfort along B LB     Lumbar Exercises: Sidelying   Other Sidelying Lumbar Exercises R/L open book stretch x10 each to tolerance      Lumbar Exercises: Prone   Other Prone Lumbar Exercises prone on hands 5x3"   cues to maintain hips down   Other Prone Lumbar Exercises prone on elbows 10x3"   good tolerance  PT Education - 03/04/20 1054    Education Details update to HEP- administered yellow TB for resisted marching instead of red; edu on modifications to allow for improved tolerance for cleaning the tub and prolonged sitting/standing    Person(s) Educated Patient    Methods Explanation;Demonstration;Tactile cues;Verbal cues;Handout    Comprehension Verbalized understanding;Returned demonstration            PT Short Term Goals - 03/04/20 1217      PT SHORT TERM GOAL #1   Title Patient to be independent with initial HEP.    Time 3    Period Weeks    Status On-going    Target Date 03/17/20             PT Long Term Goals - 03/04/20 1217      PT LONG TERM GOAL #1   Title Patient to be independent with advanced HEP.    Time 6    Period Weeks    Status On-going      PT LONG TERM GOAL #2   Title Patient to demonstrate atleast 4+/5 strength in L hip flexor and quad.    Time 6    Period Weeks    Status On-going      PT LONG TERM GOAL #3   Title Patient to  demonstrate lumbar AROM WFL and without pain limiting.    Time 6    Period Weeks    Status On-going      PT LONG TERM GOAL #4   Title Patient to report 70% improvement in LBP with household chores.    Time 6    Period Weeks    Status On-going      PT LONG TERM GOAL #5   Title Patient to report tolerance for a full shift at work without pain limiting.    Time 6    Period Weeks    Status On-going                 Plan - 03/04/20 1100    Clinical Impression Statement Patient reporting compliance with HEP until Friday, when she noticed more N/T in the L LE. Notes that she discontinued her HEP as she did not know what was causing the paresthesias, but denies specific worsening of her symptoms with any particular exercise. Notes no B&B changes. Patient performed prone on elbows and prone on hands stretching with good tolerance. Did c/o mild discomfort along B LB with bridges, however patient demonstrated good form and ROM. Noted increased tightness on L vs. R side of LB with gentle lumbopelvic stretching, thus this was updated into HEP for continued practice. Reviewed resisted marching, with patient initially demonstrating slight anterior trunk lean and heavy use of UEs. Patient noting that perhaps this exercise contributed to her LB discomfort, thus administered yellow band for decreased resistance and improved comfort. Advised patient to discontinue this exercise if LBP or paresthesias worsen. Patient reported understanding. Discussed modifications to allow for improved tolerance for cleaning the tub and prolonged sitting/standing, as patient noting remaining difficulty with these activities. Patient reported understanding of edu provided and without complaints at end of session.    Comorbidities HTN, migraine, asthma    PT Treatment/Interventions ADLs/Self Care Home Management;Cryotherapy;Electrical Stimulation;Moist Heat;Traction;Balance training;Therapeutic exercise;Therapeutic  activities;Functional mobility training;Stair training;Gait training;Ultrasound;Neuromuscular re-education;Patient/family education;Manual techniques;Taping;Energy conservation;Dry needling;Passive range of motion    PT Next Visit Plan progress L hip flexor and quad strengthening and lumbopelvic ROM    Consulted and Agree with Plan of Care  Patient           Patient will benefit from skilled therapeutic intervention in order to improve the following deficits and impairments:  Decreased activity tolerance, Decreased strength, Pain, Increased fascial restricitons, Decreased balance, Decreased range of motion, Improper body mechanics, Postural dysfunction, Impaired flexibility  Visit Diagnosis: Chronic left-sided low back pain with left-sided sciatica  Other symptoms and signs involving the musculoskeletal system     Problem List Patient Active Problem List   Diagnosis Date Noted  . Left foot pain 05/26/2015  . Osteopenia 05/06/2015  . Achilles tendonitis, bilateral 05/05/2015  . Metatarsalgia of left foot 05/05/2015  . Pronation deformity of ankle, acquired 05/05/2015  . Flu-like symptoms 08/22/2014  . Piriformis syndrome 02/20/2014  . Low back pain with radiation 02/05/2014  . Routine general medical examination at a health care facility 09/09/2013  . Neck pain 08/28/2013  . Shoulder pain 06/18/2013  . Neck mass 01/23/2013  . Eczema of external ear 01/23/2013  . Depression with anxiety 04/21/2012  . HTN (hypertension) 09/06/2011  . Chronic cough 09/06/2011  . Colon polyps 09/06/2011     Janene Harvey, PT, DPT 03/04/20 12:18 PM   [facility] [address]  [place], [place], [address] Phone: [phone]   Fax:  [phone]  Name: Wendy Chambers MRN: [id number] Date of Birth: 05-08-1956   PHYSICAL THERAPY DISCHARGE SUMMARY  Visits from Start of Care: 2  Current functional level related to  goals / functional outcomes: Unable to assess; patient cx'd all appointments d/t starting a new job   Remaining deficits: Unable to assess   Education / Equipment: HEP  Plan: Patient agrees to discharge.  Patient goals were not met. Patient is being discharged due to the patient's request.  ?????     Janene Harvey, PT, DPT 03/12/20 1:35 PM

## 2020-03-11 ENCOUNTER — Ambulatory Visit: Payer: BC Managed Care – PPO | Admitting: Physical Therapy

## 2020-03-12 ENCOUNTER — Ambulatory Visit: Payer: Self-pay | Admitting: Neurology

## 2020-04-07 DIAGNOSIS — L570 Actinic keratosis: Secondary | ICD-10-CM | POA: Diagnosis not present

## 2020-04-07 DIAGNOSIS — D485 Neoplasm of uncertain behavior of skin: Secondary | ICD-10-CM | POA: Diagnosis not present

## 2020-04-07 DIAGNOSIS — L814 Other melanin hyperpigmentation: Secondary | ICD-10-CM | POA: Diagnosis not present

## 2020-04-07 DIAGNOSIS — X32XXXS Exposure to sunlight, sequela: Secondary | ICD-10-CM | POA: Diagnosis not present

## 2020-04-07 DIAGNOSIS — C44612 Basal cell carcinoma of skin of right upper limb, including shoulder: Secondary | ICD-10-CM | POA: Diagnosis not present

## 2020-04-20 ENCOUNTER — Encounter: Payer: Self-pay | Admitting: Family

## 2020-04-21 ENCOUNTER — Telehealth (INDEPENDENT_AMBULATORY_CARE_PROVIDER_SITE_OTHER): Payer: BC Managed Care – PPO | Admitting: Family

## 2020-04-21 ENCOUNTER — Encounter: Payer: Self-pay | Admitting: Family

## 2020-04-21 ENCOUNTER — Other Ambulatory Visit: Payer: Self-pay

## 2020-04-21 VITALS — BP 133/71 | HR 74

## 2020-04-21 DIAGNOSIS — F32A Depression, unspecified: Secondary | ICD-10-CM | POA: Diagnosis not present

## 2020-04-21 DIAGNOSIS — Z7185 Encounter for immunization safety counseling: Secondary | ICD-10-CM

## 2020-04-21 DIAGNOSIS — I10 Essential (primary) hypertension: Secondary | ICD-10-CM | POA: Diagnosis not present

## 2020-04-21 DIAGNOSIS — J45909 Unspecified asthma, uncomplicated: Secondary | ICD-10-CM

## 2020-04-21 MED ORDER — CITALOPRAM HYDROBROMIDE 20 MG PO TABS
ORAL_TABLET | ORAL | 0 refills | Status: DC
Start: 2020-04-21 — End: 2020-06-02

## 2020-04-21 NOTE — Progress Notes (Signed)
Virtual Visit via Video Note  I connected with Wendy Chambers [facility] on 04/21/20 at 10:40 AM EDT by a video enabled telemedicine application and verified that I am speaking with the correct person using two identifiers.  Location: Patient: home Provider: work   I discussed the limitations of evaluation and management by telemedicine and the availability of in person appointments. The patient expressed understanding and agreed to proceed. Only the patient and myself were present for today's video call.   History of Present Illness:  Reports that she is starting a job where she is working at home.  Feeling more isolated. Feels like she is sleeping more.  Most recently she has been waking up thinking about things and can't get back to sleep. She was previously on citalopram and discontinued about 4-5 months. She felt like she was doing better at that time.  Now she realizes that the medication was helping her more than she thought and she would like to restart it.   Depression screen [facility] 2/9 04/21/2020 10/01/2019 05/18/2018 04/13/2016  Decreased Interest 3 1 0 (No Data)  Down, Depressed, Hopeless 2 1 0 (No Data)  PHQ - 2 Score 5 2 0 -  Altered sleeping 1 3 1  -  Tired, decreased energy 3 3 1  -  Change in appetite 1 3 2  -  Feeling bad or failure about yourself  1 1 0 -  Trouble concentrating 2 1 0 -  Moving slowly or fidgety/restless 0 0 0 -  Suicidal thoughts 0 0 0 -  PHQ-9 Score 13 13 4  -  Difficult doing work/chores Very difficult Somewhat difficult - -   Asthma- has symptoms around cats.  Rare use of prn albuterol.    HTN- maintained on amlodipine 5mg  and hctz 25mg .     Observations/Objective:   Gen: Awake, alert, no acute distress Resp: Breathing is even and non-labored Psych: calm/pleasant demeanor Neuro: Alert and Oriented x 3, + facial symmetry, speech is clear.   Assessment and Plan:  HTN- bp stable. Continue amlodipine 5mg  and HCTZ 25mg .  She will return for follow up  BMET.  Depression- uncontrolled. Would like to get back on citalopram. Will restart citalopram 20mg  1/2 tab once daily for 1 week, then increase to a full tab once daily on week two.  Immunization counseling- pt has not been vaccinated against covid-19. States that she had covid back in January. She declines vaccination. I did encourage her to to seek vaccination and answered her questions about the vaccine. Advised her that she can become re-infected with covid in the future.   Asthma- stable with prn use of albuterol MDI.  Continue same.   Follow Up Instructions:    I discussed the assessment and treatment plan with the patient. The patient was provided an opportunity to ask questions and all were answered. The patient agreed with the plan and demonstrated an understanding of the instructions.   The patient was advised to call back or seek an in-person evaluation if the symptoms worsen or if the condition fails to improve as anticipated.  Nance Pear, NP

## 2020-04-30 ENCOUNTER — Other Ambulatory Visit: Payer: Self-pay

## 2020-04-30 ENCOUNTER — Other Ambulatory Visit (INDEPENDENT_AMBULATORY_CARE_PROVIDER_SITE_OTHER): Payer: BC Managed Care – PPO

## 2020-04-30 DIAGNOSIS — I10 Essential (primary) hypertension: Secondary | ICD-10-CM

## 2020-04-30 DIAGNOSIS — C44311 Basal cell carcinoma of skin of nose: Secondary | ICD-10-CM | POA: Diagnosis not present

## 2020-04-30 DIAGNOSIS — D485 Neoplasm of uncertain behavior of skin: Secondary | ICD-10-CM | POA: Diagnosis not present

## 2020-04-30 DIAGNOSIS — C44612 Basal cell carcinoma of skin of right upper limb, including shoulder: Secondary | ICD-10-CM | POA: Diagnosis not present

## 2020-05-01 LAB — BASIC METABOLIC PANEL
BUN: 12 mg/dL (ref 7–25)
CO2: 27 mmol/L (ref 20–32)
Calcium: 9.8 mg/dL (ref 8.6–10.4)
Chloride: 100 mmol/L (ref 98–110)
Creat: 0.86 mg/dL (ref 0.50–0.99)
Glucose, Bld: 84 mg/dL (ref 65–99)
Potassium: 3.9 mmol/L (ref 3.5–5.3)
Sodium: 138 mmol/L (ref 135–146)

## 2020-05-03 ENCOUNTER — Other Ambulatory Visit: Payer: Self-pay | Admitting: Family

## 2020-06-02 ENCOUNTER — Other Ambulatory Visit: Payer: Self-pay | Admitting: Family

## 2020-06-04 ENCOUNTER — Encounter: Payer: Self-pay | Admitting: Family

## 2020-06-05 MED ORDER — CITALOPRAM HYDROBROMIDE 20 MG PO TABS
20.0000 mg | ORAL_TABLET | Freq: Every day | ORAL | 0 refills | Status: DC
Start: 2020-06-05 — End: 2020-08-25

## 2020-07-14 ENCOUNTER — Encounter: Payer: Self-pay | Admitting: Family

## 2020-08-04 ENCOUNTER — Encounter: Payer: Self-pay | Admitting: Family

## 2020-08-04 ENCOUNTER — Encounter: Payer: Self-pay | Admitting: Gastroenterology

## 2020-08-04 DIAGNOSIS — K635 Polyp of colon: Secondary | ICD-10-CM

## 2020-08-10 DIAGNOSIS — C44612 Basal cell carcinoma of skin of right upper limb, including shoulder: Secondary | ICD-10-CM | POA: Diagnosis not present

## 2020-08-10 DIAGNOSIS — L570 Actinic keratosis: Secondary | ICD-10-CM | POA: Diagnosis not present

## 2020-08-10 DIAGNOSIS — L218 Other seborrheic dermatitis: Secondary | ICD-10-CM | POA: Diagnosis not present

## 2020-08-10 DIAGNOSIS — D225 Melanocytic nevi of trunk: Secondary | ICD-10-CM | POA: Diagnosis not present

## 2020-08-13 ENCOUNTER — Other Ambulatory Visit: Payer: Self-pay | Admitting: Family

## 2020-08-18 DIAGNOSIS — C44311 Basal cell carcinoma of skin of nose: Secondary | ICD-10-CM | POA: Diagnosis not present

## 2020-08-25 ENCOUNTER — Encounter: Payer: Self-pay | Admitting: Gastroenterology

## 2020-08-25 ENCOUNTER — Ambulatory Visit: Payer: BC Managed Care – PPO | Admitting: Gastroenterology

## 2020-08-25 VITALS — BP 128/78 | HR 86 | Ht 70.0 in | Wt 217.2 lb

## 2020-08-25 DIAGNOSIS — L905 Scar conditions and fibrosis of skin: Secondary | ICD-10-CM | POA: Diagnosis not present

## 2020-08-25 DIAGNOSIS — Z86010 Personal history of colonic polyps: Secondary | ICD-10-CM | POA: Diagnosis not present

## 2020-08-25 MED ORDER — CLENPIQ 10-3.5-12 MG-GM -GM/160ML PO SOLN: 1.0000 | ORAL | 0 refills | Status: DC

## 2020-08-25 NOTE — Patient Instructions (Signed)
If you are age 63 or older, your body mass index should be between 23-30. Your Body mass index is 31.17 kg/m. If this is out of the aforementioned range listed, please consider follow up with your Primary Care Provider.  If you are age 108 or younger, your body mass index should be between 19-25. Your Body mass index is 31.17 kg/m. If this is out of the aformentioned range listed, please consider follow up with your Primary Care Provider.   We have sent the following medications to your pharmacy for you to pick up at your convenience:  Clenpiq  Due to recent changes in healthcare laws, you may see the results of your imaging and laboratory studies on MyChart before your provider has had a chance to review them.  We understand that in some cases there may be results that are confusing or concerning to you. Not all laboratory results come back in the same time frame and the provider may be waiting for multiple results in order to interpret others.  Please give [place] 48 hours in order for your provider to thoroughly review all the results before contacting the office for clarification of your results.

## 2020-08-25 NOTE — Progress Notes (Signed)
Chief Complaint: History of colon polyps   Referring Provider:     Debbrah Alar, NP   HPI:     Wendy Chambers is a 65 y.o. female with history of HTN, depression/anxiety, asthma, referred to the [facility]astroenterology Clinic for evaluation of repeat colonoscopy for ongoing polyp surveillance.  Last colonoscopy was in 2014 mm polyp with recommendation repeat in 5 years.  Prior to that, polyps, index colonoscopy at age 48.  Requested old records today.  She is otherwise without any GI symptoms.  No complaints today.  Mother with UC. Otherwise, no known family history of CRC, GI malignancy, liver disease, pancreatic disease  Endoscopic History: - Initial colonoscopt at age 32 notable for polyps, but no record for review. Requesting today.  -Colonoscopy (06/10/2013, [facility].  Indication: History of colon polyps): Moderate pandiverticulosis, 4 mm descending polyp (path not available for review), small internal hemorrhoids.  Repeat 5 years.   Past Medical History:  Diagnosis Date  . Asthma   . Colon polyp 2010  . History of chicken pox    childhood  . History of migraine   . Hypertension      Past Surgical History:  Procedure Laterality Date  . CESAREAN SECTION  2010   Family History  Problem Relation Age of Onset  . Heart disease Cousin        chf, on father's side, CAD, female  . Heart disease Cousin        chf, on father's side, female  . Lung cancer Father        died at 42  . Hyperlipidemia Father   . Hypertension Father   . Hyperlipidemia Mother   . Heart disease Paternal Grandmother   . Diabetes Neg Hx   . Heart attack Neg Hx    Social History   Tobacco Use  . Smoking status: Former Smoker    Quit date: 1990    Years since quitting: 32.1  . Smokeless tobacco: Never Used  . Tobacco comment: Quit 1990 - 1 ppd  Substance Use Topics  . Alcohol use: Yes    Alcohol/week: 0.0 standard drinks    Comment: social  . Drug use: No    Current Outpatient Medications  Medication Sig Dispense Refill  . albuterol (VENTOLIN HFA) 108 (90 Base) MCG/ACT inhaler INHALE 2 PUFFS BY MOUTH INTO THE LUNGS EVERY 6 HOURS AS NEEDED FOR WHEEZING OR SHORTNESS OF BREATH 8 g 2  . amLODipine (NORVASC) 5 MG tablet TAKE ONE TABLET BY MOUTH ONE TIME DAILY 30 tablet 5  . citalopram (CELEXA) 20 MG tablet Take 1 tablet (20 mg total) by mouth daily. 30 tablet 0  . hydrochlorothiazide (HYDRODIURIL) 25 MG tablet Take 1 tablet (25 mg total) by mouth daily. 30 tablet 0  . meloxicam (MOBIC) 7.5 MG tablet Take 1 tablet (7.5 mg total) by mouth daily. 30 tablet 0   No current facility-administered medications for this visit.   Allergies  Allergen Reactions  . Lisinopril     Other reaction(s): Dizziness (intolerance)  . Losartan Potassium     Other reaction(s): Cough (ALLERGY/intolerance)  . Tetracyclines & Related     Rash     Review of Systems: All systems reviewed and negative except where noted in HPI.     Physical Exam:    Wt Readings from Last 3 Encounters:  08/25/20 217 lb 4 oz (98.5 kg)  12/17/19 226 lb (102.5 kg)  10/14/19 228 lb 5 oz (103.6 kg)    Ht 5\' 10"  (1.778 m)   Wt 217 lb 4 oz (98.5 kg)   BMI 31.17 kg/m  Constitutional:  Pleasant, in no acute distress. Psychiatric: Normal mood and affect. Behavior is normal. EENT: Pupils normal.  Conjunctivae are normal. No scleral icterus. Neck supple. No cervical LAD. Cardiovascular: Normal rate, regular rhythm. No edema Pulmonary/chest: Effort normal and breath sounds normal. No wheezing, rales or rhonchi. Abdominal: Soft, nondistended, nontender. Bowel sounds active throughout. There are no masses palpable. No hepatomegaly. Neurological: Alert and oriented to person place and time. Skin: Skin is warm and dry. No rashes noted.   ASSESSMENT AND PLAN;   1) History of colon polyps -Last colonoscopy in 2014 with 4 mm polyp (no path available for review).  Previous recommendation  was for repeat in 5 years.  Prior to that also with polyps on index colonoscopy at age 65 (2009).  Based on previous findings, previous recommendations, and current guidelines, plan for colonoscopy now for ongoing surveillance -Schedule colonoscopy for ongoing polyp surveillance -Will obtain records from previous GI  The indications, risks, and benefits of colonoscopy were explained to the patient in detail. Risks include but are not limited to bleeding, perforation, adverse reaction to medications, and cardiopulmonary compromise. Sequelae include but are not limited to the possibility of surgery, hospitalization, and mortality. The patient verbalized understanding and wished to proceed. All questions answered, referred to the scheduler and bowel prep ordered. Further recommendations pending results of the exam.    Lavena Bullion, DO, FACG  08/25/2020, 8:27 AM   Debbrah Alar, NP

## 2020-09-21 ENCOUNTER — Other Ambulatory Visit: Payer: Self-pay

## 2020-09-21 ENCOUNTER — Encounter: Payer: Self-pay | Admitting: Family

## 2020-09-21 MED ORDER — HYDROCHLOROTHIAZIDE 25 MG PO TABS
25.0000 mg | ORAL_TABLET | Freq: Every day | ORAL | 0 refills | Status: DC
Start: 2020-09-21 — End: 2020-11-23

## 2020-09-21 MED ORDER — AMLODIPINE BESYLATE 5 MG PO TABS
5.0000 mg | ORAL_TABLET | Freq: Every day | ORAL | 0 refills | Status: DC
Start: 2020-09-21 — End: 2020-10-27

## 2020-09-21 NOTE — Telephone Encounter (Signed)
Patient called in returning Ruth's call. She refused to schedule and In person appt like requsted. She states she would just find a new doctor.

## 2020-10-05 ENCOUNTER — Encounter: Payer: Self-pay | Admitting: Gastroenterology

## 2020-10-05 ENCOUNTER — Other Ambulatory Visit: Payer: Self-pay

## 2020-10-05 ENCOUNTER — Ambulatory Visit (AMBULATORY_SURGERY_CENTER): Payer: BC Managed Care – PPO | Admitting: Gastroenterology

## 2020-10-05 VITALS — BP 110/74 | HR 79 | Temp 98.0°F | Resp 18 | Ht 70.0 in | Wt 217.0 lb

## 2020-10-05 DIAGNOSIS — D124 Benign neoplasm of descending colon: Secondary | ICD-10-CM

## 2020-10-05 DIAGNOSIS — D123 Benign neoplasm of transverse colon: Secondary | ICD-10-CM

## 2020-10-05 DIAGNOSIS — D127 Benign neoplasm of rectosigmoid junction: Secondary | ICD-10-CM | POA: Diagnosis not present

## 2020-10-05 DIAGNOSIS — Z86010 Personal history of colonic polyps: Secondary | ICD-10-CM | POA: Diagnosis not present

## 2020-10-05 DIAGNOSIS — K633 Ulcer of intestine: Secondary | ICD-10-CM | POA: Diagnosis not present

## 2020-10-05 DIAGNOSIS — K641 Second degree hemorrhoids: Secondary | ICD-10-CM

## 2020-10-05 DIAGNOSIS — K635 Polyp of colon: Secondary | ICD-10-CM

## 2020-10-05 DIAGNOSIS — K5730 Diverticulosis of large intestine without perforation or abscess without bleeding: Secondary | ICD-10-CM

## 2020-10-05 DIAGNOSIS — Z1211 Encounter for screening for malignant neoplasm of colon: Secondary | ICD-10-CM | POA: Diagnosis not present

## 2020-10-05 MED ORDER — SODIUM CHLORIDE 0.9 % IV SOLN: 500.0000 mL | Freq: Once | INTRAVENOUS | Status: DC

## 2020-10-05 NOTE — Progress Notes (Signed)
Report given to PACU, vss 

## 2020-10-05 NOTE — Patient Instructions (Signed)
Please read handouts provided. Continue present medications. Await pathology results. Consider a fiber supplement. Return to GI clinic as needed.    YOU HAD AN ENDOSCOPIC PROCEDURE TODAY AT [facility]:   Refer to the procedure report that was given to you for any specific questions about what was found during the examination.  If the procedure report does not answer your questions, please call your gastroenterologist to clarify.  If you requested that your care partner not be given the details of your procedure findings, then the procedure report has been included in a sealed envelope for you to review at your convenience later.  YOU SHOULD EXPECT: Some feelings of bloating in the abdomen. Passage of more gas than usual.  Walking can help get rid of the air that was put into your GI tract during the procedure and reduce the bloating. If you had a lower endoscopy (such as a colonoscopy or flexible sigmoidoscopy) you may notice spotting of blood in your stool or on the toilet paper. If you underwent a bowel prep for your procedure, you may not have a normal bowel movement for a few days.  Please Note:  You might notice some irritation and congestion in your nose or some drainage.  This is from the oxygen used during your procedure.  There is no need for concern and it should clear up in a day or so.  SYMPTOMS TO REPORT IMMEDIATELY:   Following lower endoscopy (colonoscopy or flexible sigmoidoscopy):  Excessive amounts of blood in the stool  Significant tenderness or worsening of abdominal pains  Swelling of the abdomen that is new, acute  Fever of 100F or higher    For urgent or emergent issues, a gastroenterologist can be reached at any hour by calling [phone]. Do not use MyChart messaging for urgent concerns.    DIET:  We do recommend a small meal at first, but then you may proceed to your regular diet.  Drink plenty of fluids but you should avoid alcoholic  beverages for 24 hours.  ACTIVITY:  You should plan to take it easy for the rest of today and you should NOT DRIVE or use heavy machinery until tomorrow (because of the sedation medicines used during the test).    FOLLOW UP: Our staff will call the number listed on your records 48-72 hours following your procedure to check on you and address any questions or concerns that you may have regarding the information given to you following your procedure. If we do not reach you, we will leave a message.  We will attempt to reach you two times.  During this call, we will ask if you have developed any symptoms of COVID 19. If you develop any symptoms (ie: fever, flu-like symptoms, shortness of breath, cough etc.) before then, please call [phone].  If you test positive for Covid 19 in the 2 weeks post procedure, please call and report this information to [place].    If any biopsies were taken you will be contacted by phone or by letter within the next 1-3 weeks.  Please call us at [phone] if you have not heard about the biopsies in 3 weeks.    SIGNATURES/CONFIDENTIALITY: You and/or your care partner have signed paperwork which will be entered into your electronic medical record.  These signatures attest to the fact that that the information above on your After Visit Summary has been reviewed and is understood.  Full responsibility of the confidentiality of this discharge information  lies with you and/or your care-partner.

## 2020-10-05 NOTE — Progress Notes (Signed)
1403 Ephedrine 10 mg given IV due to low BP, MD updated.

## 2020-10-05 NOTE — Progress Notes (Signed)
Called to room to assist during endoscopic procedure.  Patient ID and intended procedure confirmed with present staff. Received instructions for my participation in the procedure from the performing physician.  

## 2020-10-05 NOTE — Progress Notes (Signed)
Pt's states no medical or surgical changes since previsit or office visit. 

## 2020-10-05 NOTE — Op Note (Signed)
[place] Patient Name: Wendy Chambers Procedure Date: 10/05/2020 12:44 PM MRN: [id number] Endoscopist: Gerrit Heck , MD Age: 65 Referring MD:  Date of Birth: 03-05-1956 Gender: Female Account #: [id number] Procedure:                Colonoscopy Indications:              High risk colon cancer surveillance: Personal                            history of colonic polyps Medicines:                Monitored Anesthesia Care Procedure:                Pre-Anesthesia Assessment:                           - Prior to the procedure, a History and Physical                            was performed, and patient medications and                            allergies were reviewed. The patient's tolerance of                            previous anesthesia was also reviewed. The risks                            and benefits of the procedure and the sedation                            options and risks were discussed with the patient.                            All questions were answered, and informed consent                            was obtained. Prior Anticoagulants: The patient has                            taken no previous anticoagulant or antiplatelet                            agents. ASA Grade Assessment: II - A patient with                            mild systemic disease. After reviewing the risks                            and benefits, the patient was deemed in                            satisfactory condition to undergo the procedure.  After obtaining informed consent, the colonoscope                            was passed under direct vision. Throughout the                            procedure, the patient's blood pressure, pulse, and                            oxygen saturations were monitored continuously. The                            Olympus CF-HQ190 (#[id number]) Colonoscope was                            introduced through the anus and advanced  to the the                            terminal ileum. The colonoscopy was performed                            without difficulty. The patient tolerated the                            procedure well. The quality of the bowel                            preparation was good. The terminal ileum, ileocecal                            valve, appendiceal orifice, and rectum were                            photographed. Scope In: 1:42:42 PM Scope Out: 2:12:23 PM Scope Withdrawal Time: 0 hours 26 minutes 50 seconds  Total Procedure Duration: 0 hours 29 minutes 41 seconds  Findings:                 Hemorrhoids were found on perianal exam.                           Two sessile polyps were found in the descending                            colon and transverse colon. The polyps were 4 to 6                            mm in size. These polyps were removed with a cold                            snare. Resection and retrieval were complete.                            Estimated blood loss was minimal.  Two three mm ulcers with mild surrounding mucosal                            erythema were found in the descending colon. No                            bleeding was present. Biopsies were taken with a                            cold forceps for histology. Estimated blood loss                            was minimal.                           Three sessile polyps were found in the                            recto-sigmoid colon. The polyps were 1 to 2 mm in                            size. These polyps were removed with a cold biopsy                            forceps. Resection and retrieval were complete.                            Estimated blood loss was minimal.                           Multiple small and large-mouthed diverticula were                            found in the entire colon.                           Non-bleeding internal hemorrhoids were found during                             retroflexion. The hemorrhoids were small and Grade                            II (internal hemorrhoids that prolapse but reduce                            spontaneously).                           The terminal ileum appeared normal. Complications:            No immediate complications. Estimated Blood Loss:     Estimated blood loss was minimal. Impression:               - Hemorrhoids found on perianal exam.                           -  Two 4 to 6 mm polyps in the descending colon and                            in the transverse colon, removed with a cold snare.                            Resected and retrieved.                           - Two ulcers in the descending colon. Biopsied.                           - Three 1 to 2 mm polyps at the recto-sigmoid                            colon, removed with a cold biopsy forceps. Resected                            and retrieved.                           - Diverticulosis in the entire examined colon.                           - Non-bleeding internal hemorrhoids.                           - The examined portion of the ileum was normal. Recommendation:           - Patient has a contact number available for                            emergencies. The signs and symptoms of potential                            delayed complications were discussed with the                            patient. Return to normal activities tomorrow.                            Written discharge instructions were provided to the                            patient.                           - Resume previous diet.                           - Continue present medications.                           - Await pathology results.                           -  Repeat colonoscopy for surveillance based on                            pathology results.                           - Use fiber, for example Citrucel, Fibercon, Konsyl                            or Metamucil.                            - Return to GI clinic PRN.                           - Internal hemorrhoids were noted on this study and                            may be amenable to hemorrhoid band ligation. If you                            are interested in further treatment of these                            hemorrhoids with band ligation, please contact my                            clinic to set up an appointment for evaluation and                            treatment. Gerrit Heck, MD 10/05/2020 2:19:04 PM

## 2020-10-06 DIAGNOSIS — L282 Other prurigo: Secondary | ICD-10-CM | POA: Diagnosis not present

## 2020-10-06 DIAGNOSIS — L400 Psoriasis vulgaris: Secondary | ICD-10-CM | POA: Diagnosis not present

## 2020-10-07 ENCOUNTER — Telehealth: Payer: Self-pay

## 2020-10-07 NOTE — Telephone Encounter (Signed)
LVM

## 2020-10-14 ENCOUNTER — Encounter: Payer: Self-pay | Admitting: Gastroenterology

## 2020-10-26 ENCOUNTER — Encounter: Payer: Self-pay | Admitting: Family

## 2020-10-26 NOTE — Telephone Encounter (Signed)
Patient was scheduled to come in tomorrow

## 2020-10-27 ENCOUNTER — Telehealth: Payer: Self-pay | Admitting: Family

## 2020-10-27 ENCOUNTER — Ambulatory Visit: Payer: BC Managed Care – PPO | Admitting: Family

## 2020-10-27 ENCOUNTER — Other Ambulatory Visit: Payer: Self-pay

## 2020-10-27 VITALS — BP 119/70 | HR 65 | Temp 98.7°F | Resp 16 | Ht 67.0 in | Wt 206.0 lb

## 2020-10-27 DIAGNOSIS — I10 Essential (primary) hypertension: Secondary | ICD-10-CM

## 2020-10-27 LAB — BASIC METABOLIC PANEL
BUN: 15 mg/dL (ref 6–23)
CO2: 26 mEq/L (ref 19–32)
Calcium: 9 mg/dL (ref 8.4–10.5)
Chloride: 100 mEq/L (ref 96–112)
Creatinine, Ser: 0.83 mg/dL (ref 0.40–1.20)
GFR: 75.55 mL/min (ref >60.00)
Glucose, Bld: 90 mg/dL (ref 70–99)
Potassium: 4 mEq/L (ref 3.5–5.1)
Sodium: 135 mEq/L (ref 135–145)

## 2020-10-27 MED ORDER — AMLODIPINE BESYLATE 5 MG PO TABS: 5.0000 mg | ORAL_TABLET | Freq: Every day | ORAL | 5 refills | Status: DC | PRN

## 2020-10-27 MED ORDER — AMLODIPINE BESYLATE 5 MG PO TABS: 5.0000 mg | ORAL_TABLET | Freq: Every day | ORAL | 5 refills | Status: DC

## 2020-10-27 MED ORDER — SCOPOLAMINE 1 MG/3DAYS TD PT72: 1.0000 | MEDICATED_PATCH | TRANSDERMAL | 0 refills | Status: DC

## 2020-10-27 NOTE — Progress Notes (Signed)
Established Patient Office Visit  Subjective:  Patient ID: Wendy Chambers, female    DOB: [birth year]  Age: 65 y.o. MRN: [id number]  CC:  Chief Complaint  Patient presents with  . Hypertension    Here for follow up    HPI Wendy Chambers presents for follow up.  HTN- ran out of amlodipine 2 days ago. Wonders if she still needs to take it. She is maintained on hctz 25mg  once daily   BP Readings from Last 3 Encounters:  10/27/20 119/70  10/05/20 110/74  08/25/20 128/78   She is participating in a Memory Study at [facility] ([facility]).     Past Medical History:  Diagnosis Date  . Arthritis   . Asthma   . Colon polyp 2010  . Depression   . History of chicken pox    childhood  . History of migraine   . Hypertension     Past Surgical History:  Procedure Laterality Date  . CESAREAN SECTION  2010  . COLONOSCOPY     [facility]     Family History  Problem Relation Age of Onset  . Heart disease Cousin        chf, on father's side, CAD, female  . Heart disease Cousin        chf, on father's side, female  . Lung cancer Father        died at 55  . Hyperlipidemia Father   . Hypertension Father   . Hyperlipidemia Mother   . Colitis Mother   . Crohn's disease Mother   . Heart disease Paternal Grandmother   . Diabetes Neg Hx   . Heart attack Neg Hx   . Colon cancer Neg Hx   . Esophageal cancer Neg Hx     Social History   Socioeconomic History  . Marital status: Legally Separated    Spouse name: Not on file  . Number of children: Not on file  . Years of education: Not on file  . Highest education level: Not on file  Occupational History  . Occupation: works  Tobacco Use  . Smoking status: Former Smoker    Quit date: 1990    Years since quitting: 32.3  . Smokeless tobacco: Never Used  . Tobacco comment: Quit 1990 - 1 ppd  Vaping Use  . Vaping Use: Never used  Substance and Sexual Activity  . Alcohol use: Yes    Alcohol/week: 0.0 standard drinks     Comment: social  . Drug use: No  . Sexual activity: Not on file  Other Topics Concern  . Not on file  Social History Narrative   2 children- 1981 son and 2000 son   Working as a Radiographer, therapeutic in IT   Divorced   Enjoys gardening/outdoor activities.   Social Determinants of Health   Financial Resource Strain: Not on file  Food Insecurity: Not on file  Transportation Needs: Not on file  Physical Activity: Not on file  Stress: Not on file  Social Connections: Not on file  Intimate Partner Violence: Not on file    Outpatient Medications Prior to Visit  Medication Sig Dispense Refill  . albuterol (VENTOLIN HFA) 108 (90 Base) MCG/ACT inhaler INHALE 2 PUFFS BY MOUTH INTO THE LUNGS EVERY 6 HOURS AS NEEDED FOR WHEEZING OR SHORTNESS OF BREATH 8 g 2  . AMBULATORY NON FORMULARY MEDICATION Medication Name: Sam-E 1 tablet daily  Naural Fall-mainly biotin 4 tablets daily    . amLODipine (NORVASC) 5 MG tablet Take 1 tablet (5 mg total) by mouth daily. 30 tablet 0  . hydrochlorothiazide (HYDRODIURIL) 25 MG tablet Take 1 tablet (25 mg total) by mouth daily. 30 tablet 0   No facility-administered medications prior to visit.    Allergies  Allergen Reactions  . Lisinopril     Other reaction(s): Dizziness (intolerance)  . Losartan Potassium     Other reaction(s): Cough (ALLERGY/intolerance)  . Tetracyclines & Related     Rash    ROS Review of Systems    Objective:    Physical Exam Constitutional:      Appearance: She is well-developed.  Cardiovascular:     Rate and Rhythm: Normal rate and regular rhythm.     Heart sounds: Normal heart sounds. No murmur heard.   Pulmonary:     Effort: Pulmonary effort is normal. No respiratory distress.     Breath sounds: Normal breath sounds. No wheezing.  Musculoskeletal:        General: No swelling.  Skin:    General: Skin is warm and dry.  Neurological:     General: No focal deficit present.     Mental  Status: She is oriented to person, place, and time.  Psychiatric:        Behavior: Behavior normal.        Thought Content: Thought content normal.        Judgment: Judgment normal.     BP 119/70 (BP Location: Right Arm, Patient Position: Sitting, Cuff Size: Large)   Pulse 65   Temp 98.7 F (37.1 C) (Oral)   Resp 16   Ht 5\' 7"  (1.702 m)   Wt 206 lb (93.4 kg)   SpO2 99%   BMI 32.26 kg/m  Wt Readings from Last 3 Encounters:  10/27/20 206 lb (93.4 kg)  10/05/20 217 lb (98.4 kg)  08/25/20 217 lb 4 oz (98.5 kg)     Health Maintenance Due  Topic Date Due  . PAP SMEAR-Modifier  Never done    There are no preventive care reminders to display for this patient.  Lab Results  Component Value Date   TSH 2.45 10/01/2019   Lab Results  Component Value Date   WBC 10.0 10/01/2019   HGB 15.1 (H) 10/01/2019   HCT 43.4 10/01/2019   MCV 89.9 10/01/2019   PLT 402.0 (H) 10/01/2019   Lab Results  Component Value Date   NA 138 04/30/2020   K 3.9 04/30/2020   CO2 27 04/30/2020   GLUCOSE 84 04/30/2020   BUN 12 04/30/2020   CREATININE 0.86 04/30/2020   BILITOT 0.6 12/29/2016   ALKPHOS 70 12/29/2016   AST 11 12/29/2016   ALT 14 12/29/2016   PROT 7.0 12/29/2016   ALBUMIN 4.1 12/29/2016   CALCIUM 9.8 04/30/2020   GFR 77.27 10/01/2019   Lab Results  Component Value Date   CHOL 185 12/29/2016   Lab Results  Component Value Date   HDL 54.10 12/29/2016   Lab Results  Component Value Date   LDLCALC 104 (H) 12/29/2016   Lab Results  Component Value Date   TRIG 132.0 12/29/2016   Lab Results  Component Value Date   CHOLHDL 3 12/29/2016   No results found for: HGBA1C    Assessment & Plan:   Problem List Items Addressed This Visit   None     No orders of the defined types were placed in this encounter.  Follow-up: No follow-ups on file.    Nance Pear, NP

## 2020-10-27 NOTE — Telephone Encounter (Signed)
Called number provided, had to leave a voice mail for a call back

## 2020-10-27 NOTE — Telephone Encounter (Signed)
Please contact [facility] to request records release. This is the only number I could find:  [phone].

## 2020-10-27 NOTE — Assessment & Plan Note (Addendum)
Advised pt, OK to remain off of amlodipine. She will keep on hand and watch her bp carefully. If BP >140/90 she will resume amlodipine. Continue hctz 25mg  and obtain follow up bmet.

## 2020-10-27 NOTE — Patient Instructions (Addendum)
Check blood pressure daily for 1 week and let me know if bp > 140/90. Please schedule a follow up with GYN to update your pap.

## 2020-10-28 NOTE — Telephone Encounter (Signed)
Received a call back yesterday and advised form had to be emailed. For was emailed to [email or web address]

## 2020-11-02 ENCOUNTER — Telehealth: Payer: Self-pay | Admitting: Family

## 2020-11-02 NOTE — Telephone Encounter (Signed)
Please see mychart message.

## 2020-11-11 ENCOUNTER — Other Ambulatory Visit: Payer: Self-pay | Admitting: Family

## 2020-11-21 ENCOUNTER — Encounter: Payer: Self-pay | Admitting: Family

## 2020-11-23 MED ORDER — HYDROCHLOROTHIAZIDE 25 MG PO TABS: 25.0000 mg | ORAL_TABLET | Freq: Every day | ORAL | 0 refills | Status: DC

## 2020-12-21 ENCOUNTER — Telehealth: Payer: Self-pay

## 2020-12-21 NOTE — Telephone Encounter (Signed)
Called and spoke with patient. She states on her left side of her neck under ear/along jaw line she has a "ping pong ball sized lump" Noticed this past weekend. Throat felt "swollen" some discomfort with swallowing. She denies shortness of breath, no chest pain.   I tried to get her in to see a provider today however all locations providers were full. I have scheduled her to see Dr. Nani Ravens in absence of PCP tomorrow morning. Advised her to proceed to urgent care or ER if any symptoms worsen such as difficulty breathing/SHOB. Patient agreed.

## 2020-12-21 NOTE — Telephone Encounter (Signed)
Patient Name Wendy Chambers Patient DOB December 14, 1955 Call Type Message Only Information Provided Reason for Call Request to Schedule Office Appointment Initial Comment Caller states she needs an appt for today. She has a lump under ear and jawline. She is feeling closure in her throat.

## 2020-12-22 ENCOUNTER — Ambulatory Visit: Payer: BC Managed Care – PPO | Admitting: Family Medicine

## 2020-12-22 ENCOUNTER — Encounter: Payer: Self-pay | Admitting: Family Medicine

## 2020-12-22 ENCOUNTER — Telehealth: Payer: Self-pay

## 2020-12-22 ENCOUNTER — Other Ambulatory Visit: Payer: Self-pay

## 2020-12-22 VITALS — BP 118/84 | HR 60 | Temp 98.4°F | Ht 67.0 in | Wt 205.0 lb

## 2020-12-22 DIAGNOSIS — I889 Nonspecific lymphadenitis, unspecified: Secondary | ICD-10-CM | POA: Diagnosis not present

## 2020-12-22 MED ORDER — PREDNISONE 20 MG PO TABS: 40.0000 mg | ORAL_TABLET | Freq: Every day | ORAL | 0 refills | Status: AC

## 2020-12-22 NOTE — Telephone Encounter (Signed)
OK w me.  

## 2020-12-22 NOTE — Progress Notes (Signed)
Chief Complaint  Patient presents with   lump under neck since saturday    Wendy Chambers is a 65 y.o. female here for a skin complaint.  Duration: 3 days Location: L under jaw Pruritic? No Painful? Yes Drainage? No New soaps/lotions/topicals/detergents? No Sick contacts? No Other associated symptoms: No redness, getting a little smaller Therapies tried thus far: Aleve  Past Medical History:  Diagnosis Date   Arthritis    Asthma    Colon polyp 2010   Depression    History of chicken pox    childhood   History of migraine    Hypertension     BP 118/84   Pulse 60   Temp 98.4 F (36.9 C) (Oral)   Ht 5\' 7"  (1.702 m)   Wt 205 lb (93 kg)   SpO2 97%   BMI 32.11 kg/m  Gen: awake, alert, appearing stated age Lungs: No accessory muscle use Ears: Negative bilaterally Nose: Nares are patent bilaterally, no discharge/rhinorrhea Skin: Just posterior to the L mand angle there is a 2 x 2.5 cm moveable and smooth lesion that is raised. No overlying skin color changes. +ttp. No drainage, erythema, fluctuance, excoriation Psych: Age appropriate judgment and insight  Lymphadenitis - Plan: predniSONE (DELTASONE) 20 MG tablet  New problem, uncertain prognosis.  5-day prednisone burst, 40 mg daily.  If no improvement, will check CT of the soft tissue.  She will let me know if anything changes in the meanwhile.  Send message on Friday morning. F/u prn. The patient voiced understanding and agreement to the plan.  [place], DO 12/22/20 11:47 AM

## 2020-12-22 NOTE — Telephone Encounter (Signed)
Pt mentioned that she would like to transfer care from [place] to [place] to transfer?

## 2020-12-22 NOTE — Patient Instructions (Signed)
Send me a message on Friday morning if we aren't turning the corner.  Ice/cold pack over area for 10-15 min twice daily.  OK to take Tylenol 1000 mg (2 extra strength tabs) or 975 mg (3 regular strength tabs) every 6 hours as needed.  Let us know if you need anything.

## 2020-12-23 NOTE — Telephone Encounter (Signed)
OK with me.

## 2020-12-24 NOTE — Telephone Encounter (Signed)
FYI

## 2021-01-15 ENCOUNTER — Other Ambulatory Visit: Payer: Self-pay

## 2021-01-15 ENCOUNTER — Ambulatory Visit: Payer: BC Managed Care – PPO | Admitting: Family Medicine

## 2021-01-15 ENCOUNTER — Encounter: Payer: Self-pay | Admitting: Family Medicine

## 2021-01-15 VITALS — BP 122/72 | HR 93 | Temp 98.2°F | Ht 67.0 in | Wt 208.0 lb

## 2021-01-15 DIAGNOSIS — I10 Essential (primary) hypertension: Secondary | ICD-10-CM

## 2021-01-15 MED ORDER — AMLODIPINE BESYLATE 5 MG PO TABS: 5.0000 mg | ORAL_TABLET | Freq: Every day | ORAL | 2 refills | Status: DC

## 2021-01-15 NOTE — Progress Notes (Signed)
Chief Complaint  Patient presents with   Transfer of care    Subjective Wendy Chambers is a 65 y.o. female who presents for hypertension follow up. She does monitor home blood pressures. Blood pressures ranging from 120/80's on average. She is compliant with medication- Norvasc 5 mg/d. Patient has these side effects of medication: none She is adhering to a healthy diet overall. Current exercise: lifting, cardio, active in yard.  No CP or SOB.   Past Medical History:  Diagnosis Date   Arthritis    Asthma    Colon polyp 2010   Depression    History of chicken pox    childhood   History of migraine    Hypertension     Exam BP 122/72   Pulse 93   Temp 98.2 F (36.8 C) (Oral)   Ht '5\' 7"'$  (1.702 m)   Wt 208 lb (94.3 kg)   SpO2 97%   BMI 32.58 kg/m  General:  well developed, well nourished, in no apparent distress Heart: RRR, no bruits, no LE edema Lungs: clear to auscultation, no accessory muscle use Psych: well oriented with normal range of affect and appropriate judgment/insight  Essential hypertension  Chronic, stable. Cont Norvasc 5 mg/d. Pt has been eating healthier and losing wt. Monitor BP to ensure it doesn't get too low. Counseled on diet and exercise. F/u in 6 mo for CPE or prn. The patient voiced understanding and agreement to the plan.  [place], DO 01/15/21  2:53 PM

## 2021-01-15 NOTE — Patient Instructions (Signed)
Keep the diet clean and stay active.  Continue to monitor blood pressure intermittently. Let me know if it gets to <110/70 consistently.   Let us know if you need anything.

## 2021-01-22 ENCOUNTER — Telehealth: Payer: Self-pay | Admitting: Family Medicine

## 2021-01-22 NOTE — Telephone Encounter (Signed)
DOS 10/26 was billed to the wrong [facility] provider. PT has a Mychart appt and her new insurance card was not on file. Please resubmit dos to [facility] that we currently have updated

## 2021-02-03 NOTE — Telephone Encounter (Signed)
I have emailed [employer] and asked them to file DOS 04/21/20 to the [place] information we currently have on file for patient.

## 2021-05-14 ENCOUNTER — Encounter: Payer: Self-pay | Admitting: Family Medicine

## 2021-05-14 ENCOUNTER — Other Ambulatory Visit: Payer: Self-pay

## 2021-05-14 ENCOUNTER — Ambulatory Visit: Payer: BC Managed Care – PPO | Admitting: Family Medicine

## 2021-05-14 VITALS — BP 122/72 | HR 79 | Temp 98.1°F | Ht 67.0 in | Wt 214.0 lb

## 2021-05-14 DIAGNOSIS — F339 Major depressive disorder, recurrent, unspecified: Secondary | ICD-10-CM | POA: Diagnosis not present

## 2021-05-14 DIAGNOSIS — R300 Dysuria: Secondary | ICD-10-CM | POA: Diagnosis not present

## 2021-05-14 LAB — URINALYSIS, ROUTINE W REFLEX MICROSCOPIC
Bilirubin Urine: NEGATIVE
Ketones, ur: NEGATIVE
Nitrite: NEGATIVE
Specific Gravity, Urine: 1.01 (ref 1.000–1.030)
Total Protein, Urine: NEGATIVE
Urine Glucose: NEGATIVE
Urobilinogen, UA: 0.2 (ref 0.0–1.0)
pH: 6 (ref 5.0–8.0)

## 2021-05-14 MED ORDER — SULFAMETHOXAZOLE-TRIMETHOPRIM 800-160 MG PO TABS: 1.0000 | ORAL_TABLET | Freq: Two times a day (BID) | ORAL | 0 refills | Status: AC

## 2021-05-14 MED ORDER — CITALOPRAM HYDROBROMIDE 20 MG PO TABS: 20.0000 mg | ORAL_TABLET | Freq: Every day | ORAL | 3 refills | Status: DC

## 2021-05-14 NOTE — Progress Notes (Signed)
Chief Complaint  Patient presents with   Follow-up    Medication--Celexa     Subjective Wendy Chambers presents for f/u depression.  Pt is currently being treated with nothing.  Over the last 2-3 mo, getting worse.  No thoughts of harming self or others. No self-medication with alcohol, prescription drugs or illicit drugs. Pt is not following with a counselor/psychologist.  UTI Several days of a UTI w dysuria. No fevers, N/V, flank pain. Tried taking Azo, s/s's persisting.   Past Medical History:  Diagnosis Date   Arthritis    Asthma    Colon polyp 2010   Depression    History of chicken pox    childhood   History of migraine    Hypertension    Allergies as of 05/14/2021       Reactions   Lisinopril    Other reaction(s): Dizziness (intolerance)   Losartan Potassium    Other reaction(s): Cough (ALLERGY/intolerance)   Tetracyclines & Related    Rash        Medication List        Accurate as of May 14, 2021  7:20 AM. If you have any questions, ask your nurse or doctor.          albuterol 108 (90 Base) MCG/ACT inhaler Commonly known as: Ventolin HFA INHALE 2 PUFFS BY MOUTH INTO THE LUNGS EVERY 6 HOURS AS NEEDED FOR WHEEZING OR SHORTNESS OF BREATH   AMBULATORY NON FORMULARY MEDICATION Medication Name: Sam-E 1 tablet daily                               Naural Fall-mainly biotin 4 tablets daily   amLODipine 5 MG tablet Commonly known as: NORVASC Take 1 tablet (5 mg total) by mouth daily.   citalopram 20 MG tablet Commonly known as: CELEXA Take 1 tablet (20 mg total) by mouth daily. Started by: Shelda Pal, DO   sulfamethoxazole-trimethoprim 800-160 MG tablet Commonly known as: BACTRIM DS Take 1 tablet by mouth 2 (two) times daily for 3 days. Started by: Shelda Pal, DO        Exam BP 122/72   Pulse 79   Temp 98.1 F (36.7 C) (Oral)   Ht 5\' 7"  (1.702 m)   Wt 214 lb (97.1 kg)   SpO2 99%   BMI 33.52 kg/m  General:   well developed, well nourished, in no apparent distress Heart: RRR Abd: BS+, S, NT, ND MSK: No cva ttp.  Lungs:  CTAB. No respiratory distress Psych: well oriented with normal range of affect and age-appropriate judgement/insight, alert and oriented x4.  Assessment and Plan  Depression, recurrent ([place]) - Plan: citalopram (CELEXA) 20 MG tablet  Dysuria - Plan: Urinalysis, Urine Culture, sulfamethoxazole-trimethoprim (BACTRIM DS) 800-160 MG tablet  Chronic, unstable. Could be seasonal? Suggested looking into light therapy. Counseling info provided. Counseled on exercise. Restart Celexa 10 mg/d for 2 weeks and then increase to 20 mg/d as she has done well before. Ck urine, empirically tx w Bactrim bid for 3 d.  F/u as originally scheduled in Jan. The patient voiced understanding and agreement to the plan.  [place], DO 05/14/21 7:20 AM

## 2021-05-14 NOTE — Patient Instructions (Addendum)
Aim to do some physical exertion for 150 minutes per week. This is typically divided into 5 days per week, 30 minutes per day. The activity should be enough to get your heart rate up. Anything is better than nothing if you have time constraints.  Take 1/2 tab of Celexa for the first   Please consider counseling. Contact [phone] to schedule an appointment or inquire about cost/insurance coverage.  [facility] located at [place], [place], [place].  Phone number = [phone].  Dr. Lennice Sites - Adult Psychiatry.    [facility] located at [place], [place], [place]. Phone number = [phone].   [facility] located at [address] [place], [place].  Phone number = [phone].   [facility] located at [place], [place], [place].  Phone number = [phone].  Coping skills Choose 5 that work for you: Take a deep breath Count to 20 Read a book Do a puzzle Meditate Bake Sing Knit Garden Pray Go outside Call a friend Listen to music Take a walk Color Send a note Take a bath Watch a movie Be alone in a quiet place Pet an animal Visit a friend Journal Exercise Stretch

## 2021-05-16 LAB — URINE CULTURE
MICRO NUMBER:: 12657222
SPECIMEN QUALITY:: ADEQUATE

## 2021-07-20 ENCOUNTER — Encounter: Payer: Self-pay | Admitting: Family Medicine

## 2021-07-20 ENCOUNTER — Ambulatory Visit (INDEPENDENT_AMBULATORY_CARE_PROVIDER_SITE_OTHER): Payer: BC Managed Care – PPO | Admitting: Family Medicine

## 2021-07-20 VITALS — BP 128/78 | HR 68 | Temp 98.2°F | Ht 67.0 in | Wt 218.4 lb

## 2021-07-20 DIAGNOSIS — F339 Major depressive disorder, recurrent, unspecified: Secondary | ICD-10-CM

## 2021-07-20 DIAGNOSIS — Z0000 Encounter for general adult medical examination without abnormal findings: Secondary | ICD-10-CM

## 2021-07-20 LAB — COMPREHENSIVE METABOLIC PANEL
ALT: 17 U/L (ref 0–35)
AST: 17 U/L (ref 0–37)
Albumin: 4.3 g/dL (ref 3.5–5.2)
Alkaline Phosphatase: 97 U/L (ref 39–117)
BUN: 16 mg/dL (ref 6–23)
CO2: 30 mEq/L (ref 19–32)
Calcium: 9.6 mg/dL (ref 8.4–10.5)
Chloride: 101 mEq/L (ref 96–112)
Creatinine, Ser: 0.87 mg/dL (ref 0.40–1.20)
GFR: 71.03 mL/min (ref >60.00)
Glucose, Bld: 90 mg/dL (ref 70–99)
Potassium: 4.2 mEq/L (ref 3.5–5.1)
Sodium: 137 mEq/L (ref 135–145)
Total Bilirubin: 0.6 mg/dL (ref 0.2–1.2)
Total Protein: 7 g/dL (ref 6.0–8.3)

## 2021-07-20 LAB — CBC
HCT: 43.9 % (ref 36.0–46.0)
Hemoglobin: 14.6 g/dL (ref 12.0–15.0)
MCHC: 33.2 g/dL (ref 30.0–36.0)
MCV: 89.8 fl (ref 78.0–100.0)
Platelets: 384 10*3/uL (ref 150.0–400.0)
RBC: 4.89 Mil/uL (ref 3.87–5.11)
RDW: 12.9 % (ref 11.5–15.5)
WBC: 8.6 10*3/uL (ref 4.0–10.5)

## 2021-07-20 LAB — LIPID PANEL
Cholesterol: 213 mg/dL — ABNORMAL HIGH (ref 0–200)
HDL: 59.2 mg/dL (ref >39.00)
LDL Cholesterol: 127 mg/dL — ABNORMAL HIGH (ref 0–99)
NonHDL: 154.25
Total CHOL/HDL Ratio: 4
Triglycerides: 138 mg/dL (ref 0.0–149.0)
VLDL: 27.6 mg/dL (ref 0.0–40.0)

## 2021-07-20 MED ORDER — CITALOPRAM HYDROBROMIDE 20 MG PO TABS: 20.0000 mg | ORAL_TABLET | Freq: Every day | ORAL | 2 refills | Status: DC

## 2021-07-20 MED ORDER — AMLODIPINE BESYLATE 5 MG PO TABS: 5.0000 mg | ORAL_TABLET | Freq: Every day | ORAL | 2 refills | Status: DC

## 2021-07-20 NOTE — Patient Instructions (Addendum)
Call Center for [place] at [facility] at [phone] for an appointment.  They are located at [address], [address], [place], [place], [address] (right across the hall from our office).  Give [place] 2-3 business days to get the results of your labs back.   Keep the diet clean and stay active.  Bivalent COVID vaccination booster recommended.   Let us know if you need anything.

## 2021-07-20 NOTE — Progress Notes (Signed)
Chief Complaint  Patient presents with   Annual Exam     Well Woman Wendy Chambers is here for a complete physical.   Her last physical was >1 year ago.  Current diet: in general, a "healthy" diet. Current exercise: working with Physiological scientist 2x/week. Weight is stable and she denies fatigue out of ordinary. Seatbelt? Yes Advanced directive? No  Health Maintenance Pap/HPV- Due Mammogram- Yes Colon cancer screening-Yes Shingrix- Yes Tetanus- Yes Hep C screening- Yes HIV screening- Yes  Past Medical History:  Diagnosis Date   Arthritis    Asthma    Colon polyp 2010   Depression    History of chicken pox    childhood   History of migraine    Hypertension      Past Surgical History:  Procedure Laterality Date   CESAREAN SECTION  2010   COLONOSCOPY     [facility]     Medications  Current Outpatient Medications on File Prior to Visit  Medication Sig Dispense Refill   albuterol (VENTOLIN HFA) 108 (90 Base) MCG/ACT inhaler INHALE 2 PUFFS BY MOUTH INTO THE LUNGS EVERY 6 HOURS AS NEEDED FOR WHEEZING OR SHORTNESS OF BREATH 8 g 2   AMBULATORY NON FORMULARY MEDICATION Medication Name: Sam-E 1 tablet daily                               Naural Fall-mainly biotin 4 tablets daily      Allergies Allergies  Allergen Reactions   Lisinopril     Other reaction(s): Dizziness (intolerance)   Losartan Potassium     Other reaction(s): Cough (ALLERGY/intolerance)   Tetracyclines & Related     Rash    Review of Systems: Constitutional:  no unexpected weight changes Eye:  no recent significant change in vision Ear/Nose/Mouth/Throat:  Ears:  no recent change in hearing Nose/Mouth/Throat:  no complaints of nasal congestion, no sore throat Cardiovascular: no chest pain Respiratory:  no shortness of breath Gastrointestinal:  no abdominal pain, no change in bowel habits GU:  Female: negative for dysuria or pelvic pain Musculoskeletal/Extremities:  no pain of the  joints Integumentary (Skin/Breast):  no abnormal skin lesions reported Neurologic:  no headaches Endocrine:  denies fatigue  Exam BP 128/78    Pulse 68    Temp 98.2 F (36.8 C) (Oral)    Ht 5\' 7"  (1.702 m)    Wt 218 lb 6 oz (99.1 kg)    SpO2 98%    BMI 34.20 kg/m  General:  well developed, well nourished, in no apparent distress Skin:  no significant moles, warts, or growths Head:  no masses, lesions, or tenderness Eyes:  pupils equal and round, sclera anicteric without injection Ears:  canals without lesions, TMs shiny without retraction, no obvious effusion, no erythema Nose:  nares patent, septum midline, mucosa normal, and no drainage or sinus tenderness Throat/Pharynx:  lips and gingiva without lesion; tongue and uvula midline; non-inflamed pharynx; no exudates or postnasal drainage Neck: neck supple without adenopathy, thyromegaly, or masses Lungs:  clear to auscultation, breath sounds equal bilaterally, no respiratory distress Cardio:  regular rate and rhythm, no LE edema Abdomen:  abdomen soft, nontender; bowel sounds normal; no masses or organomegaly Genital: Defer to GYN Musculoskeletal:  symmetrical muscle groups noted without atrophy or deformity Extremities:  no clubbing, cyanosis, or edema, no deformities, no skin discoloration Neuro:  gait normal; deep tendon reflexes normal and symmetric Psych: well oriented with normal  range of affect and appropriate judgment/insight  Assessment and Plan  Well adult exam - Plan: CBC, Comprehensive metabolic panel, Lipid panel  Depression, recurrent ([place]) - Plan: citalopram (CELEXA) 20 MG tablet   Well 66 y.o. female. Counseled on diet and exercise. Other orders as above. Bivalent COVID vaccination booster recommended.  Advanced directive form provided today.  GYN info provided.  Follow up 6 mo. The patient voiced understanding and agreement to the plan.  [place], DO 07/20/21 8:37 AM

## 2021-08-16 ENCOUNTER — Encounter: Payer: Self-pay | Admitting: Family Medicine

## 2021-08-16 ENCOUNTER — Ambulatory Visit: Payer: BC Managed Care – PPO | Admitting: Family Medicine

## 2021-08-16 VITALS — BP 138/80 | HR 78 | Temp 98.8°F | Ht 67.0 in | Wt 217.5 lb

## 2021-08-16 DIAGNOSIS — J0100 Acute maxillary sinusitis, unspecified: Secondary | ICD-10-CM

## 2021-08-16 MED ORDER — PREDNISONE 20 MG PO TABS: 40.0000 mg | ORAL_TABLET | Freq: Every day | ORAL | 0 refills | Status: AC

## 2021-08-16 MED ORDER — AMOXICILLIN-POT CLAVULANATE 875-125 MG PO TABS: 1.0000 | ORAL_TABLET | Freq: Two times a day (BID) | ORAL | 0 refills | Status: AC

## 2021-08-16 NOTE — Progress Notes (Signed)
Chief Complaint  Patient presents with   Sinusitis    Facial pain Headaches     Wendy Chambers here for URI complaints.  Duration: 5 days  Associated symptoms: sinus headache, sinus pain, dental pain, and ear drainage Denies: sinus congestion, rhinorrhea, itchy watery eyes, ear pain, sore throat, wheezing, shortness of breath, myalgia, and fevers, coughing Treatment to date: Advil Sick contacts: No  Past Medical History:  Diagnosis Date   Arthritis    Asthma    Colon polyp 2010   Depression    History of chicken pox    childhood   History of migraine    Hypertension     Objective BP 138/80    Pulse 78    Temp 98.8 F (37.1 C) (Oral)    Ht 5\' 7"  (1.702 m)    Wt 217 lb 8 oz (98.7 kg)    SpO2 97%    BMI 34.07 kg/m  General: Awake, alert, appears stated age HEENT: AT, [place], ears patent b/l and TM's neg, nares patent w/o discharge, pharynx pink and without exudates, MMM, maxillary sinuses tender to palpation bilaterally Neck: No masses or asymmetry Heart: RRR Lungs: CTAB, no accessory muscle use Psych: Age appropriate judgment and insight, normal mood and affect  Acute maxillary sinusitis, recurrence not specified - Plan: predniSONE (DELTASONE) 20 MG tablet, amoxicillin-clavulanate (AUGMENTIN) 875-125 MG tablet  5 d pred burst 40 mg/d. If no better in 3 d, will take 7 d course of Augmentin. Continue to push fluids, practice good hand hygiene, cover mouth when coughing. F/u prn. If starting to experience fevers, shaking, or shortness of breath, seek immediate care. Pt voiced understanding and agreement to the plan.  [place], DO 08/16/21 1:39 PM

## 2021-08-16 NOTE — Patient Instructions (Signed)
Wait 3 days prior to taking the antibiotic.  OK to take Tylenol 1000 mg (2 extra strength tabs) or 975 mg (3 regular strength tabs) every 6 hours as needed.  No Advil while on the prednisone.   Let us know if you need anything.

## 2021-08-18 ENCOUNTER — Encounter: Payer: Self-pay | Admitting: Family Medicine

## 2021-09-16 ENCOUNTER — Telehealth (INDEPENDENT_AMBULATORY_CARE_PROVIDER_SITE_OTHER): Payer: BC Managed Care – PPO | Admitting: Family Medicine

## 2021-09-16 ENCOUNTER — Encounter: Payer: Self-pay | Admitting: Family Medicine

## 2021-09-16 DIAGNOSIS — J40 Bronchitis, not specified as acute or chronic: Secondary | ICD-10-CM | POA: Diagnosis not present

## 2021-09-16 DIAGNOSIS — J329 Chronic sinusitis, unspecified: Secondary | ICD-10-CM | POA: Diagnosis not present

## 2021-09-16 MED ORDER — AZITHROMYCIN 250 MG PO TABS: ORAL_TABLET | ORAL | 0 refills | Status: DC

## 2021-09-16 NOTE — Progress Notes (Signed)
Virtual Video Visit via MyChart Note ? ?I connected with  [name] on 09/16/21 at 11:20 AM EDT by the video enabled telemedicine application for MyChart, and verified that I am speaking with the correct person using two identifiers. ?  ?I introduced myself as a Designer, jewellery with the practice. We discussed the limitations of evaluation and management by telemedicine and the availability of in person appointments. The patient expressed understanding and agreed to proceed. ? ?Participating parties in this visit include: The patient and the nurse practitioner listed.  ?The patient is: At home ?I am: In the office - [facility] at [facility] ? ?Subjective:   ? ?CC: cough ? ? ?HPI: Wendy Chambers is a 66 y.o. year old female presenting today via [place] today for cough. ? ?Patient reports that towards the end of February she was treated for sinus infection with Augmentin and prednisone.  States she had started feeling mostly better although she did have some lingering maxillary sinus pressure and ear pressure bilaterally with some occasional rhinorrhea.  Additionally she has had ongoing allergy symptoms including sneezing, rhinorrhea, postnasal drip.  States that about 5 to 6 days ago she started noticing a sore throat that progressively worsened and she has now had at least 5 days of chest congestion with productive cough (green sputum).  States she will cough almost constantly for the first hour and a half every morning to try to clear some of that up and then randomly throughout the day.  She has also been having some headaches and continued ear pressure/popping, occasional sneezing, shortness of breath.  So far she has been taking TheraFlu, Mucinex, Advil with no improvement.  She had a negative COVID test 3 days ago.  She has not had any sick contacts.  She denies sinus pressure, fevers, nausea, vomiting, diarrhea, chest pain, wheezing, body aches, chills.  Reports she does have a history  of asthma triggered by allergies but this has been well controlled and no flares.  She has not been using her albuterol inhaler as she has not needed it. ? ? ? ?Past medical history, Surgical history, Family history not pertinant except as noted below, Social history, Allergies, and medications have been entered into the medical record, reviewed, and corrections made.  ? ?Review of Systems:  ?All review of systems negative except what is listed in the HPI ? ? ?Objective:   ? ?General:  ?Speaking clearly in complete sentences. ?Absent shortness of breath noted.   ?Alert and oriented x3.   ?Normal judgment.  ?Absent acute distress. ? ? ?Impression and Recommendations:   ? ?1. Sinobronchitis ?- azithromycin (ZITHROMAX Z-PAK) 250 MG tablet; Take 2 tablets (500 mg) on  Day 1,  followed by 1 tablet (250 mg) once daily on Days 2 through 5.  Dispense: 6 tablet; Refill: 0 ? ?Sounds possibly like some double sickening following recent sinus infection although this is now seeming to be more in her chest.  She does not feel like she is to the extent where she needs prednisone or more inhalers at this time.  We will start with a Z-Pak since she just recently had Augmentin and is allergic to doxycycline. Continue supportive measures including rest, hydration, humidifier use, steam showers, warm compresses to sinuses, warm liquids with lemon and honey, and over-the-counter cough, cold, and analgesics as needed.  .[email or web address]  ? ? ?Follow-up if symptoms worsen or fail to improve.  ?  ?I discussed the assessment and treatment plan with the  patient. The patient was provided an opportunity to ask questions and all were answered. The patient agreed with the plan and demonstrated an understanding of the instructions. ?  ?The patient was advised to call back or seek an in-person evaluation if the symptoms worsen or if the condition fails to improve as anticipated. ? ?I spent 20 minutes dedicated to the care of this patient on the date of this  encounter to include pre-visit chart review of prior notes and results, face-to-face time with the patient, and post-visit ordering of testing as indicated.  ? ?Terrilyn Saver, NP  ? ?

## 2021-09-16 NOTE — Progress Notes (Signed)
Sx started Sat ?Neg covid Monday ?Taking OTC cold/flu, Advil ?

## 2021-09-21 ENCOUNTER — Encounter: Payer: Self-pay | Admitting: Family Medicine

## 2021-09-21 ENCOUNTER — Ambulatory Visit (INDEPENDENT_AMBULATORY_CARE_PROVIDER_SITE_OTHER): Payer: BC Managed Care – PPO | Admitting: Family Medicine

## 2021-09-21 VITALS — BP 120/78 | HR 69 | Temp 98.6°F | Ht 67.0 in | Wt 217.0 lb

## 2021-09-21 DIAGNOSIS — J302 Other seasonal allergic rhinitis: Secondary | ICD-10-CM | POA: Diagnosis not present

## 2021-09-21 DIAGNOSIS — H6982 Other specified disorders of Eustachian tube, left ear: Secondary | ICD-10-CM

## 2021-09-21 MED ORDER — FLUTICASONE PROPIONATE 50 MCG/ACT NA SUSP: 2.0000 | Freq: Every day | NASAL | 2 refills | Status: AC

## 2021-09-21 MED ORDER — FLUTICASONE PROPIONATE HFA 110 MCG/ACT IN AERO: 2.0000 | INHALATION_SPRAY | Freq: Two times a day (BID) | RESPIRATORY_TRACT | 1 refills | Status: DC

## 2021-09-21 NOTE — Progress Notes (Signed)
Chief Complaint  ?Patient presents with  ? Headache  ?  Sinus infection ?Sore throat ?  ? ? ?Wendy Chambers here for URI complaints. ? ?Duration: a few weeks  ?Associated symptoms: Fever (99.6 F), sore throat, and coughing, popping in ears, headaches ?Denies: sinus congestion, sinus pain, rhinorrhea, itchy watery eyes, ear pain, ear drainage, wheezing, shortness of breath, myalgia, and N/V/D ?Treatment to date: Zpak, Advil ?Sick contacts: No ? ?Past Medical History:  ?Diagnosis Date  ? Arthritis   ? Asthma   ? Colon polyp 2010  ? Depression   ? History of chicken pox   ? childhood  ? History of migraine   ? Hypertension   ? ? ?Objective ?BP 120/78   Pulse 69   Temp 98.6 ?F (37 ?C) (Oral)   Ht '5\' 7"'$  (1.702 m)   Wt 217 lb (98.4 kg)   SpO2 99%   BMI 33.99 kg/m?  ?General: Awake, alert, appears stated age ?HEENT: AT, [place], ears patent b/l and TM neg on R, retracted on L, nares patent w/o discharge, pharynx pink and without exudates, MMM ?Neck: No masses or asymmetry ?Heart: RRR ?Lungs: CTAB, no accessory muscle use ?Psych: Age appropriate judgment and insight, normal mood and affect ? ?Dysfunction of left eustachian tube - Plan: fluticasone (FLONASE) 50 MCG/ACT nasal spray ? ?Seasonal allergies - Plan: fluticasone (FLONASE) 50 MCG/ACT nasal spray ? ?INCS, cont PO antihistamine. This may help with coughing. Could be post infectious cough. Sent in ICS to start next week if not starting to improve. Rinse mouth out after using.  ?F/u prn. If starting to experience fevers, shaking, or shortness of breath, seek immediate care. ?Pt voiced understanding and agreement to the plan. ? ?Shelda Pal, DO ?09/21/21 ?11:51 AM ? ?

## 2021-09-21 NOTE — Patient Instructions (Signed)
Continue to push fluids, practice good hand hygiene, and cover your mouth if you cough. ? ?If you start having fevers, shaking or shortness of breath, seek immediate care. ? ?Stay on your daily allergy medication. Compare prices for the prescription nasal spray and over the counter.  ? ?Start the inhaler in 1 week if not turning the corner.  ? ?Claritin (loratadine), Allegra (fexofenadine), Zyrtec (cetirizine) which is also equivalent to Xyzal (levocetirizine); these are listed in order from weakest to strongest. Generic, and therefore cheaper, options are in the parentheses.  ? ?Flonase (fluticasone); nasal spray that is over the counter. 2 sprays each nostril, once daily. Aim towards the same side eye when you spray. ? ?There are available OTC, and the generic versions, which may be cheaper, are in parentheses. Show this to a pharmacist if you have trouble finding any of these items. ? ?Let us know if you need anything. ?

## 2021-09-24 DIAGNOSIS — H5711 Ocular pain, right eye: Secondary | ICD-10-CM | POA: Diagnosis not present

## 2021-11-01 ENCOUNTER — Encounter: Payer: Self-pay | Admitting: Family Medicine

## 2021-11-01 ENCOUNTER — Ambulatory Visit: Payer: BC Managed Care – PPO | Admitting: Family Medicine

## 2021-11-01 VITALS — BP 122/80 | HR 66 | Temp 98.5°F | Ht 67.0 in | Wt 220.0 lb

## 2021-11-01 DIAGNOSIS — Z1231 Encounter for screening mammogram for malignant neoplasm of breast: Secondary | ICD-10-CM

## 2021-11-01 DIAGNOSIS — N61 Mastitis without abscess: Secondary | ICD-10-CM | POA: Diagnosis not present

## 2021-11-01 MED ORDER — CEPHALEXIN 500 MG PO CAPS: 500.0000 mg | ORAL_CAPSULE | Freq: Three times a day (TID) | ORAL | 0 refills | Status: AC

## 2021-11-01 NOTE — Patient Instructions (Signed)
Ice/cold pack over area for 10-15 min twice daily. ? ?Consider warm compresses a couple times a day. ? ?OK to take Tylenol 1000 mg (2 extra strength tabs) or 975 mg (3 regular strength tabs) every 6 hours as needed. ? ?Let us know if you need anything. ?

## 2021-11-01 NOTE — Progress Notes (Signed)
Chief Complaint  ?Patient presents with  ? lump in right breast  ? ? ?Wendy Chambers is a 66 y.o. female here for a skin complaint. ? ?Duration: 2 weeks ?Location: Upper R chest ?Pruritic? No ?Painful? Not unless touching it ?Drainage? No ?New soaps/lotions/topicals/detergents? No ?Other associated symptoms: feels like it is bigger ?Therapies tried thus far: none ? ?Past Medical History:  ?Diagnosis Date  ? Arthritis   ? Asthma   ? Colon polyp 2010  ? Depression   ? History of chicken pox   ? childhood  ? History of migraine   ? Hypertension   ? ? ?BP 122/80   Pulse 66   Temp 98.5 ?F (36.9 ?C) (Oral)   Ht '5\' 7"'$  (1.702 m)   Wt 220 lb (99.8 kg)   SpO2 94%   BMI 34.46 kg/m?  ?Gen: awake, alert, appearing stated age ?Lungs: No accessory muscle use ?Skin: Over the proximal medial portion of breast on border to chest, there is an indurated area with overlying erythema and warmth compared to the surrounding area; it measures approximately 2 cm in diameter and does blanch.  It is tender to palpation without excoriation, fluctuance, drainage.  There is a centrally located subcutaneous pustule with palpation.  The patient did not need to remove her bra or shirt.  Located over costochondral region at rib 3/4 ?Psych: Age appropriate judgment and insight ? ?Cellulitis of breast - Plan: cephALEXin (KEFLEX) 500 MG capsule ? ?Encounter for screening mammogram for malignant neoplasm of breast - Plan: MM DIGITAL SCREENING BILATERAL ? ?No history of MRSA.  Keflex for 7 days.  Use warm compresses, ice for comfort, Tylenol as needed.  If it gets worse, she may need incision and drainage. ?F/u prn. ?The patient voiced understanding and agreement to the plan. ? ?Shelda Pal, DO ?11/01/21 ?9:40 AM ? ?

## 2021-12-14 ENCOUNTER — Encounter (HOSPITAL_BASED_OUTPATIENT_CLINIC_OR_DEPARTMENT_OTHER): Payer: Self-pay

## 2021-12-14 ENCOUNTER — Other Ambulatory Visit: Payer: Self-pay | Admitting: Family Medicine

## 2021-12-14 ENCOUNTER — Telehealth: Payer: Self-pay | Admitting: Family Medicine

## 2021-12-14 ENCOUNTER — Ambulatory Visit (HOSPITAL_BASED_OUTPATIENT_CLINIC_OR_DEPARTMENT_OTHER)
Admission: RE | Admit: 2021-12-14 | Discharge: 2021-12-14 | Disposition: A | Discharge status: Home / Self Care | Payer: BC Managed Care – PPO | Source: Ambulatory Visit | Attending: Family Medicine | Admitting: Family Medicine

## 2021-12-14 DIAGNOSIS — R928 Other abnormal and inconclusive findings on diagnostic imaging of breast: Secondary | ICD-10-CM

## 2021-12-14 DIAGNOSIS — Z1231 Encounter for screening mammogram for malignant neoplasm of breast: Secondary | ICD-10-CM | POA: Insufficient documentation

## 2021-12-14 NOTE — Telephone Encounter (Signed)
Diagnostic mammogram ordered. Patient informed

## 2021-12-14 NOTE — Telephone Encounter (Signed)
Pt called stating that she had a screening with [facility] in [place] and they said they wanted to do a Diagnostic Mammogram and required a referral from Dr. Nani Ravens.

## 2021-12-15 ENCOUNTER — Telehealth (HOSPITAL_BASED_OUTPATIENT_CLINIC_OR_DEPARTMENT_OTHER): Payer: Self-pay

## 2022-01-17 ENCOUNTER — Ambulatory Visit: Payer: BC Managed Care – PPO | Admitting: Family Medicine

## 2022-05-23 ENCOUNTER — Other Ambulatory Visit: Payer: Self-pay | Admitting: Family Medicine

## 2022-05-23 DIAGNOSIS — F339 Major depressive disorder, recurrent, unspecified: Secondary | ICD-10-CM

## 2022-06-07 DIAGNOSIS — C44212 Basal cell carcinoma of skin of right ear and external auricular canal: Secondary | ICD-10-CM | POA: Diagnosis not present

## 2022-06-07 DIAGNOSIS — D485 Neoplasm of uncertain behavior of skin: Secondary | ICD-10-CM | POA: Diagnosis not present

## 2022-08-15 DIAGNOSIS — C44212 Basal cell carcinoma of skin of right ear and external auricular canal: Secondary | ICD-10-CM | POA: Diagnosis not present

## 2022-08-22 DIAGNOSIS — C44212 Basal cell carcinoma of skin of right ear and external auricular canal: Secondary | ICD-10-CM | POA: Diagnosis not present

## 2022-08-27 ENCOUNTER — Other Ambulatory Visit: Payer: Self-pay | Admitting: Family Medicine

## 2022-10-10 DIAGNOSIS — L905 Scar conditions and fibrosis of skin: Secondary | ICD-10-CM | POA: Diagnosis not present

## 2022-11-01 ENCOUNTER — Ambulatory Visit: Payer: BC Managed Care – PPO | Admitting: Family Medicine

## 2022-11-01 ENCOUNTER — Encounter: Payer: Self-pay | Admitting: Family Medicine

## 2022-11-01 ENCOUNTER — Ambulatory Visit (INDEPENDENT_AMBULATORY_CARE_PROVIDER_SITE_OTHER): Payer: BC Managed Care – PPO

## 2022-11-01 VITALS — BP 110/65 | HR 78 | Temp 98.3°F | Ht 67.0 in | Wt 222.5 lb

## 2022-11-01 DIAGNOSIS — M79671 Pain in right foot: Secondary | ICD-10-CM | POA: Diagnosis not present

## 2022-11-01 DIAGNOSIS — R42 Dizziness and giddiness: Secondary | ICD-10-CM

## 2022-11-01 DIAGNOSIS — S92351A Displaced fracture of fifth metatarsal bone, right foot, initial encounter for closed fracture: Secondary | ICD-10-CM | POA: Diagnosis not present

## 2022-11-01 NOTE — Patient Instructions (Addendum)
Please get your X-ray done at the [facility] in [place]: [address], [place], [place] [address] [phone]  You do not need an appointment for this location.   Ice/cold pack over area for 10-15 min twice daily.  OK to take Tylenol 1000 mg (2 extra strength tabs) or 975 mg (3 regular strength tabs) every 6 hours as needed.  Ibuprofen 400-600 mg (2-3 over the counter strength tabs) every 6 hours as needed for pain.  Go back on the nasal spray for now. If it continues happening, let me know.   Let us know if you need anything.

## 2022-11-01 NOTE — Progress Notes (Signed)
Musculoskeletal Exam  Patient: Wendy Chambers DOB: January 12, 1956  DOS: 11/01/2022  SUBJECTIVE:  Chief Complaint:   Chief Complaint  Patient presents with   Foot Injury    Right Dizziness     Wendy Chambers is a 67 y.o.  female for evaluation and treatment of R ft pain.   Onset:  3 days ago. Slipped coming out of the shower and it hit the toilet Location: R outer foot Character:  sharp  Progression of issue:  is unchanged Associated symptoms: swelling, bruising Treatment: to date has been ice and OTC NSAIDS.   Neurovascular symptoms: no  Lightheadedness Over the past 2 weeks, has had episodes of lightheadedness randomly. Happened 2-3 times. Lasts fo ra few seconds. No LOC, palpitations, bleeding, N/V/D, PO intake changes, SOB, recent illness.   Past Medical History:  Diagnosis Date   Arthritis    Asthma    Colon polyp 2010   Depression    History of chicken pox    childhood   History of migraine    Hypertension     Objective: VITAL SIGNS: BP 110/65 (BP Location: Left Arm, Patient Position: Sitting, Cuff Size: Large)   Pulse 78   Temp 98.3 F (36.8 C) (Oral)   Ht 5\' 7"  (1.702 m)   Wt 222 lb 8 oz (100.9 kg)   SpO2 98%   BMI 34.85 kg/m  Constitutional: Well formed, well developed. No acute distress. Heart: RRR, no murmurs, no bruits Mouth: MMM Thorax & Lungs: CTAB. No accessory muscle use Musculoskeletal: R foot.   Tenderness to palpation: yes Deformity: no Ecchymosis: no Edema: Yes over dorsum of foot Neurologic: Normal sensory function. No focal deficits noted.  Antalgic gait.  DTRs equal and symmetric throughout.  Dix-Hallpike negative bilaterally Psychiatric: Normal mood. Age appropriate judgment and insight. Alert & oriented x 3.    Assessment:  Right foot pain - Plan: DG Foot Complete Right  Lightheadedness  Plan: Check an x-ray, she was put in the flat soled shoe today.  Heat, ice, Tylenol.  Stay hydrated.  Go on a daily steroid nasal spray.  If no  improvement, she will let us know. The patient voiced understanding and agreement to the plan.   Jilda Roche [place], DO 11/01/22  3:12 PM

## 2022-11-06 ENCOUNTER — Other Ambulatory Visit: Payer: Self-pay | Admitting: Family Medicine

## 2022-11-06 DIAGNOSIS — S92901A Unspecified fracture of right foot, initial encounter for closed fracture: Secondary | ICD-10-CM

## 2022-11-08 ENCOUNTER — Encounter: Payer: Self-pay | Admitting: Family Medicine

## 2022-11-09 ENCOUNTER — Other Ambulatory Visit: Payer: Self-pay | Admitting: Family Medicine

## 2022-11-09 DIAGNOSIS — S92901A Unspecified fracture of right foot, initial encounter for closed fracture: Secondary | ICD-10-CM

## 2023-03-22 ENCOUNTER — Other Ambulatory Visit: Payer: Self-pay | Admitting: Family Medicine

## 2023-03-22 DIAGNOSIS — F339 Major depressive disorder, recurrent, unspecified: Secondary | ICD-10-CM

## 2023-06-19 ENCOUNTER — Other Ambulatory Visit: Payer: Self-pay | Admitting: Family Medicine

## 2023-07-14 ENCOUNTER — Telehealth: Payer: Self-pay | Admitting: Emergency Medicine

## 2023-07-14 NOTE — Telephone Encounter (Signed)
Copied from CRM [id number]. Topic: Clinical - Request for Lab/Test Order >> Jul 14, 2023  4:53 PM Corin V wrote: Reason for CRM: Patient scheduled her physical for 08/15/23. Please order labs and call if those need to be done prior to the physical

## 2023-07-17 ENCOUNTER — Telehealth: Payer: BC Managed Care – PPO | Admitting: Family Medicine

## 2023-07-18 ENCOUNTER — Other Ambulatory Visit: Payer: Self-pay

## 2023-07-18 DIAGNOSIS — I10 Essential (primary) hypertension: Secondary | ICD-10-CM

## 2023-07-18 NOTE — Telephone Encounter (Signed)
Called pt and labs ordered, lab appt scheduled.

## 2023-07-29 ENCOUNTER — Other Ambulatory Visit: Payer: Self-pay | Admitting: Family Medicine

## 2023-08-07 ENCOUNTER — Encounter: Payer: Self-pay | Admitting: Family Medicine

## 2023-08-07 ENCOUNTER — Other Ambulatory Visit (INDEPENDENT_AMBULATORY_CARE_PROVIDER_SITE_OTHER): Payer: 59

## 2023-08-07 ENCOUNTER — Other Ambulatory Visit: Payer: Self-pay

## 2023-08-07 ENCOUNTER — Ambulatory Visit (INDEPENDENT_AMBULATORY_CARE_PROVIDER_SITE_OTHER): Payer: 59

## 2023-08-07 DIAGNOSIS — R739 Hyperglycemia, unspecified: Secondary | ICD-10-CM

## 2023-08-07 DIAGNOSIS — I10 Essential (primary) hypertension: Secondary | ICD-10-CM | POA: Diagnosis not present

## 2023-08-07 LAB — CBC WITH DIFFERENTIAL/PLATELET
Basophils Absolute: 0.1 10*3/uL (ref 0.0–0.1)
Basophils Relative: 0.7 % (ref 0.0–3.0)
Eosinophils Absolute: 0.3 10*3/uL (ref 0.0–0.7)
Eosinophils Relative: 3.9 % (ref 0.0–5.0)
HCT: 44.1 % (ref 36.0–46.0)
Hemoglobin: 14.9 g/dL (ref 12.0–15.0)
Lymphocytes Relative: 27 % (ref 12.0–46.0)
Lymphs Abs: 2 10*3/uL (ref 0.7–4.0)
MCHC: 33.8 g/dL (ref 30.0–36.0)
MCV: 90.8 fL (ref 78.0–100.0)
Monocytes Absolute: 0.7 10*3/uL (ref 0.1–1.0)
Monocytes Relative: 9.5 % (ref 3.0–12.0)
Neutro Abs: 4.4 10*3/uL (ref 1.4–7.7)
Neutrophils Relative %: 58.9 % (ref 43.0–77.0)
Platelets: 421 10*3/uL — ABNORMAL HIGH (ref 150.0–400.0)
RBC: 4.86 Mil/uL (ref 3.87–5.11)
RDW: 12.9 % (ref 11.5–15.5)
WBC: 7.4 10*3/uL (ref 4.0–10.5)

## 2023-08-07 LAB — LIPID PANEL
Cholesterol: 186 mg/dL (ref 0–200)
HDL: 54.2 mg/dL (ref >39.00)
LDL Cholesterol: 111 mg/dL — ABNORMAL HIGH (ref 0–99)
NonHDL: 131.66
Total CHOL/HDL Ratio: 3
Triglycerides: 104 mg/dL (ref 0.0–149.0)
VLDL: 20.8 mg/dL (ref 0.0–40.0)

## 2023-08-07 LAB — COMPREHENSIVE METABOLIC PANEL
ALT: 15 U/L (ref 0–35)
AST: 15 U/L (ref 0–37)
Albumin: 4.2 g/dL (ref 3.5–5.2)
Alkaline Phosphatase: 93 U/L (ref 39–117)
BUN: 16 mg/dL (ref 6–23)
CO2: 27 meq/L (ref 19–32)
Calcium: 9 mg/dL (ref 8.4–10.5)
Chloride: 101 meq/L (ref 96–112)
Creatinine, Ser: 0.75 mg/dL (ref 0.40–1.20)
GFR: 83.67 mL/min (ref >60.00)
Glucose, Bld: 101 mg/dL — ABNORMAL HIGH (ref 70–99)
Potassium: 4.4 meq/L (ref 3.5–5.1)
Sodium: 137 meq/L (ref 135–145)
Total Bilirubin: 0.7 mg/dL (ref 0.2–1.2)
Total Protein: 6.7 g/dL (ref 6.0–8.3)

## 2023-08-07 LAB — HEMOGLOBIN A1C: Hgb A1c MFr Bld: 5.6 % (ref 4.6–6.5)

## 2023-08-08 ENCOUNTER — Encounter: Payer: Self-pay | Admitting: Family Medicine

## 2023-08-15 ENCOUNTER — Encounter: Payer: Self-pay | Admitting: Family Medicine

## 2023-08-15 ENCOUNTER — Ambulatory Visit (INDEPENDENT_AMBULATORY_CARE_PROVIDER_SITE_OTHER): Payer: 59 | Admitting: Family Medicine

## 2023-08-15 VITALS — BP 118/72 | HR 78 | Temp 98.0°F | Resp 16 | Ht 67.0 in | Wt 229.8 lb

## 2023-08-15 DIAGNOSIS — Z0000 Encounter for general adult medical examination without abnormal findings: Secondary | ICD-10-CM | POA: Diagnosis not present

## 2023-08-15 DIAGNOSIS — Z6835 Body mass index (BMI) 35.0-35.9, adult: Secondary | ICD-10-CM

## 2023-08-15 DIAGNOSIS — Z23 Encounter for immunization: Secondary | ICD-10-CM | POA: Diagnosis not present

## 2023-08-15 DIAGNOSIS — Z1231 Encounter for screening mammogram for malignant neoplasm of breast: Secondary | ICD-10-CM

## 2023-08-15 MED ORDER — PHENTERMINE HCL 37.5 MG PO CAPS: 37.5000 mg | ORAL_CAPSULE | ORAL | 0 refills | Status: DC

## 2023-08-15 MED ORDER — AMLODIPINE BESYLATE 5 MG PO TABS: 5.0000 mg | ORAL_TABLET | Freq: Every day | ORAL | 11 refills | Status: AC

## 2023-08-15 NOTE — Telephone Encounter (Signed)
 Last referral in 2018, did you want to send a new referral to GYN?

## 2023-08-15 NOTE — Patient Instructions (Signed)
 Keep the diet clean and stay active.  Aim to do some physical exertion for 150 minutes per week. This is typically divided into 5 days per week, 30 minutes per day. The activity should be enough to get your heart rate up. Anything is better than nothing if you have time constraints.  Call Center for [facility] at [facility] at [phone] for an appointment.  They are located at [address], [address], [place], [place], [address] (right across the hall from our office).  Please get me a copy of your advanced directive form at your convenience.   Let us know if you need anything.

## 2023-08-15 NOTE — Progress Notes (Signed)
 Chief Complaint  Patient presents with   Annual Exam    Annual Exam     Well Woman Wendy Chambers is here for a complete physical.   Her last physical was >1 year ago.  Current diet: in general, a "healthy" diet. Current exercise: none. Weight is up a little and she denies daytime fatigue. Seatbelt? Yes Advanced directive? No  Health Maintenance Colonoscopy- Yes Shingrix- Yes DEXA- Yes Mammogram- No Tetanus- Yes Pneumonia- No Hep C screen- Yes  Obesity The patient has been gaining weight.  Diet/exercise as above.  She is interested in something to help her lose weight.  She has no interest in bariatric surgery and would like something temporary.  She does not have diabetes.  She did an online weight watchers in the past which did help.  She was not able to sustain it.  She has not been through any other supervised weight loss program.  She has never been on a weight loss medication.  Past Medical History:  Diagnosis Date   Arthritis    Asthma    Colon polyp 2010   Depression    History of chicken pox    childhood   History of migraine    Hypertension      Past Surgical History:  Procedure Laterality Date   CESAREAN SECTION  2010   COLONOSCOPY     [facility]     Medications  Current Outpatient Medications on File Prior to Visit  Medication Sig Dispense Refill   albuterol (VENTOLIN HFA) 108 (90 Base) MCG/ACT inhaler INHALE 2 PUFFS BY MOUTH INTO THE LUNGS EVERY 6 HOURS AS NEEDED FOR WHEEZING OR SHORTNESS OF BREATH 8 g 2   AMBULATORY NON FORMULARY MEDICATION Medication Name: Sam-E 1 tablet daily                               Naural Fall-mainly biotin 4 tablets daily     amLODipine (NORVASC) 5 MG tablet TAKE ONE TABLET BY MOUTH ONE TIME DAILY 30 tablet 0   citalopram (CELEXA) 20 MG tablet TAKE ONE TABLET BY MOUTH ONE TIME DAILY 90 tablet 2   fluticasone (FLONASE) 50 MCG/ACT nasal spray Place 2 sprays into both nostrils daily. 16 g 2   fluticasone (FLOVENT HFA) 110  MCG/ACT inhaler Inhale 2 puffs into the lungs in the morning and at bedtime. Rinse mouth out after use. 1 each 1    Allergies Allergies  Allergen Reactions   Lisinopril     Other reaction(s): Dizziness (intolerance)   Losartan Potassium     Other reaction(s): Cough (ALLERGY/intolerance)   Tetracyclines & Related     Rash    Review of Systems: Constitutional:  no fevers Eye:  no recent significant change in vision Ears:  No changes in hearing Nose/Mouth/Throat:  no complaints of nasal congestion, no sore throat Cardiovascular: no chest pain Respiratory:  No shortness of breath Gastrointestinal:  No change in bowel habits GU:  Female: negative for dysuria Integumentary:  no abnormal skin lesions reported Neurologic:  no headaches Endocrine:  denies unexplained weight changes  Exam BP 118/72 (BP Location: Left Arm, Patient Position: Sitting)   Pulse 78   Temp 98 F (36.7 C) (Oral)   Resp 16   Ht 5\' 7"  (1.702 m)   Wt 229 lb 12.8 oz (104.2 kg)   BMI 35.99 kg/m  General:  well developed, well nourished, in no apparent distress Skin:  no  significant moles, warts, or growths Head:  no masses, lesions, or tenderness Eyes:  pupils equal and round, sclera anicteric without injection Ears:  canals without lesions, TMs shiny without retraction, no obvious effusion, no erythema Nose:  nares patent, mucosa normal, and no drainage Throat/Pharynx:  lips and gingiva without lesion; tongue and uvula midline; non-inflamed pharynx; no exudates or postnasal drainage Neck: neck supple without adenopathy, thyromegaly, or masses Lungs:  clear to auscultation, breath sounds equal bilaterally, no respiratory distress Cardio:  regular rate and rhythm, no bruits or LE edema Abdomen:  abdomen soft, nontender; bowel sounds normal; no masses or organomegaly Genital: Deferred Neuro:  gait normal; deep tendon reflexes normal and symmetric Psych: well oriented with normal range of affect and  appropriate judgment/insight  Assessment and Plan  Well adult exam  Encounter for screening mammogram for malignant neoplasm of breast - Plan: MM DIGITAL SCREENING BILATERAL  Morbid obesity (HCC) - Plan: phentermine 37.5 MG capsule  Need for pneumococcal 20-valent conjugate vaccination - Plan: Pneumococcal conjugate vaccine 20-valent (Prevnar 20)   Well 68 y.o. female. Counseled on diet and exercise. Advanced directive form provided today.  Obesity: Chronic, uncontrolled. Start phentermine 37.5 mg/d. F/u in 1 mo. Discussed side effects and alts like GLP-1's, MWM, bariatric surgery. Follow up in 1 mo. PCV 20 today. Gynecology information provided.  Mammogram ordered. Other orders as above. The patient voiced understanding and agreement to the plan.  Jilda Roche [place], DO 08/15/23 11:05 AM

## 2023-08-24 ENCOUNTER — Encounter: Payer: Self-pay | Admitting: Family Medicine

## 2023-08-25 ENCOUNTER — Encounter: Payer: Self-pay | Admitting: Family Medicine

## 2023-08-25 ENCOUNTER — Ambulatory Visit: Payer: 59 | Admitting: Family Medicine

## 2023-08-25 VITALS — BP 130/78 | HR 67 | Temp 97.5°F | Resp 20 | Ht 67.0 in | Wt 226.2 lb

## 2023-08-25 DIAGNOSIS — L308 Other specified dermatitis: Secondary | ICD-10-CM

## 2023-08-25 MED ORDER — LEVOCETIRIZINE DIHYDROCHLORIDE 5 MG PO TABS: 5.0000 mg | ORAL_TABLET | Freq: Every evening | ORAL | 2 refills | Status: DC

## 2023-08-25 MED ORDER — TRIAMCINOLONE ACETONIDE 0.1 % EX CREA: 1.0000 | TOPICAL_CREAM | Freq: Two times a day (BID) | CUTANEOUS | 0 refills | Status: AC

## 2023-08-25 NOTE — Patient Instructions (Addendum)
 Try not to scratch as this can make things worse. Avoid scented products while dealing with this. You may resume when the itchiness resolves. Cold/cool compresses can help.   Continue with Vaseline.   Claritin (loratadine), Allegra (fexofenadine), Zyrtec (cetirizine) which is also equivalent to Xyzal (levocetirizine); these are listed in order from weakest to strongest. Generic, and therefore cheaper, options are in the parentheses.   There are available OTC, and the generic versions, which may be cheaper, are in parentheses. Show this to a pharmacist if you have trouble finding any of these items.  Let us know if you need anything.

## 2023-08-25 NOTE — Progress Notes (Signed)
 Chief Complaint  Patient presents with   Acute Visit    Patient presents today for bilateral arm and lower leg itching/ rash for a fewer weeks now. She reports placing anit-itch cream/ lotions on the areas.     Wendy Chambers is a 68 y.o. female here for a skin complaint.  Duration: a few weeks Location: forearms, legs Pruritic? Yes Painful? No Drainage? No New soaps/lotions/topicals/detergents? No Sick contacts? No Other associated symptoms: no fevers Therapies tried thus far: Calamine, Vaseline  Past Medical History:  Diagnosis Date   Arthritis    Asthma    Colon polyp 2010   Depression    History of chicken pox    childhood   History of migraine    Hypertension     BP 130/78   Pulse 67   Temp (!) 97.5 F (36.4 C)   Resp 20   Ht 5\' 7"  (1.702 m)   Wt 226 lb 3.2 oz (102.6 kg)   SpO2 98%   BMI 35.43 kg/m  Gen: awake, alert, appearing stated age Lungs: No accessory muscle use Skin: Various and discrete excoriations over the forearms and legs bilaterally.  There is no tracking, erythema, fluctuance, drainage, TTP, or scaling.   Psych: Age appropriate judgment and insight  Pruritic dermatitis - Plan: levocetirizine (XYZAL) 5 MG tablet, triamcinolone cream (KENALOG) 0.1 %  I see no evidence of insect bites, scabies.  Will have her take an oral antihistamine daily in addition to using Kenalog as needed.  Avoid scented products.  Try not to scratch.  Ice/cold pack as needed. F/u prn. The patient voiced understanding and agreement to the plan.  Jilda Roche [place], DO 08/25/23 9:56 AM

## 2023-09-12 ENCOUNTER — Encounter: Payer: Self-pay | Admitting: Family Medicine

## 2023-09-12 ENCOUNTER — Encounter (HOSPITAL_BASED_OUTPATIENT_CLINIC_OR_DEPARTMENT_OTHER): Payer: Self-pay

## 2023-09-12 ENCOUNTER — Ambulatory Visit: Payer: 59 | Admitting: Family Medicine

## 2023-09-12 ENCOUNTER — Ambulatory Visit (HOSPITAL_BASED_OUTPATIENT_CLINIC_OR_DEPARTMENT_OTHER)
Admission: RE | Admit: 2023-09-12 | Discharge: 2023-09-12 | Disposition: A | Discharge status: Home / Self Care | Payer: 59 | Source: Ambulatory Visit | Attending: Family Medicine | Admitting: Family Medicine

## 2023-09-12 DIAGNOSIS — Z1231 Encounter for screening mammogram for malignant neoplasm of breast: Secondary | ICD-10-CM | POA: Diagnosis present

## 2023-09-12 MED ORDER — PHENTERMINE HCL 37.5 MG PO CAPS: 37.5000 mg | ORAL_CAPSULE | ORAL | 0 refills | Status: DC

## 2023-09-12 NOTE — Progress Notes (Signed)
 Chief Complaint  Patient presents with   Follow-up    Patient presents today for a month follow-up   Acute Visit    Patient reports blisters on lower lip that is painful.    Subjective: Patient is a 68 y.o. female here for f/u.  Patient started on phentermine 37.5 mg daily.  She reports compliance and mostly no adverse effects.  She has lost around 7 pounds.  Diet is healthy overall.  Appetite decreased.  She is active in the yard for exercise.  It does make her mouth dry.  She has had some ulcers in her mouth.  Past Medical History:  Diagnosis Date   Arthritis    Asthma    Colon polyp 2010   Depression    History of chicken pox    childhood   History of migraine    Hypertension     Objective: BP 134/86   Pulse 74   Temp 98.3 F (36.8 C)   Ht 5\' 7"  (1.702 m)   Wt 222 lb 3.2 oz (100.8 kg)   SpO2 98%   BMI 34.80 kg/m  General: Awake, appears stated age Heart: RRR, no LE edema Lungs: CTAB, no rales, wheezes or rhonchi. No accessory muscle use Mouth: MMD.  Aphthous ulcers noted over the mucosa of the lower lip. Psych: Age appropriate judgment and insight, normal affect and mood  Assessment and Plan: Morbid obesity (HCC) - Plan: phentermine 37.5 MG capsule, phentermine 37.5 MG capsule  Chronic, improving.  Continue phentermine 37.5 mg daily for the next 60 days.  Counseled on diet and exercise.  Recommended Biotene mouthwash for salivary stimulation.  Can consider sour items, lozenges, or gum.  I will see her in 6 months for her regular med check. The patient voiced understanding and agreement to the plan.  Wendy Roche [place], DO 09/12/23  8:54 AM

## 2023-09-12 NOTE — Patient Instructions (Signed)
 Keep the diet clean and stay active.  Let us know if you need anything.

## 2023-10-09 NOTE — Progress Notes (Unsigned)
 ANNUAL EXAM Patient name: Wendy Chambers MRN [id number]  Date of birth: 09-Nov-1955 Chief Complaint:   No chief complaint on file.  History of Present Illness:   Wendy Chambers is a 68 y.o. No obstetric history on file.  female being seen today for a routine annual exam.  Current complaints:   No LMP recorded. Patient is postmenopausal.   The pregnancy intention screening data noted above was reviewed. Potential methods of contraception were discussed. The patient elected to proceed with No data recorded.   Last pap ***. Results were: {Pap findings:25134}. H/O abnormal pap: {yes/yes***/no:23866} Last mammogram: 09/12/23. Results were: normal. Family h/o breast cancer: no Last colonoscopy: 2022.      08/15/2023    9:52 AM 11/01/2022    2:10 PM 11/01/2021    8:47 AM 07/20/2021    8:18 AM 10/27/2020   11:27 AM  Depression screen PHQ 2/9  Decreased Interest 0 0 0 0 0  Down, Depressed, Hopeless 0 0 1 0 0  PHQ - 2 Score 0 0 1 0 0  Altered sleeping 0 0 1    Tired, decreased energy 0 0 0    Change in appetite 0 0 1    Feeling bad or failure about yourself  0 0 0    Trouble concentrating 0 0 0    Moving slowly or fidgety/restless 0 0 0    Suicidal thoughts 0 0 0    PHQ-9 Score 0 0 3    Difficult doing work/chores Not difficult at all Not difficult at all Not difficult at all          08/15/2023    9:52 AM 04/21/2020   10:43 AM  GAD 7 : Generalized Anxiety Score  Nervous, Anxious, on Edge 0 1  Control/stop worrying 0 1  Worry too much - different things 0 1  Trouble relaxing 0 0  Restless 0 1  Easily annoyed or irritable 0 1  Afraid - awful might happen 0 0  Total GAD 7 Score 0 5  Anxiety Difficulty Not difficult at all      Review of Systems:   Pertinent items are noted in HPI Denies any headaches, blurred vision, fatigue, shortness of breath, chest pain, abdominal pain, abnormal vaginal discharge/itching/odor/irritation, problems with periods, bowel movements, urination, or  intercourse unless otherwise stated above. Pertinent History Reviewed:  Reviewed past medical,surgical, social and family history.  Reviewed problem list, medications and allergies. Physical Assessment:  There were no vitals filed for this visit.There is no height or weight on file to calculate BMI.        Physical Examination:   General appearance - well appearing, and in no distress  Mental status - alert, oriented to person, place, and time  Psych:  She has a normal mood and affect  Skin - warm and dry, normal color, no suspicious lesions noted  Chest - effort normal, all lung fields clear to auscultation bilaterally  Heart - normal rate and regular rhythm  Neck:  midline trachea, no thyromegaly or nodules  Breasts - breasts appear normal, no suspicious masses, no skin or nipple changes or  axillary nodes  Abdomen - soft, nontender, nondistended, no masses or organomegaly  Pelvic - VULVA: normal appearing vulva with no masses, tenderness or lesions  VAGINA: normal appearing vagina with normal color and discharge, no lesions  CERVIX: normal appearing cervix without discharge or lesions, no CMT  Thin prep pap is {Desc; done/not:10129} *** HR HPV cotesting  UTERUS: uterus  is felt to be normal size, shape, consistency and nontender   ADNEXA: No adnexal masses or tenderness noted.  Rectal - normal rectal, good sphincter tone, no masses felt. Hemoccult: ***  Extremities:  No swelling or varicosities noted  Chaperone present for exam  No results found for this or any previous visit (from the past 24 hours).  Assessment & Plan:  1) Well-Woman Exam  2) ***  Labs/procedures today: ***  Mammogram: in 1 year, or sooner if problems Colonoscopy: per GI, or sooner if problems  No orders of the defined types were placed in this encounter.   Meds: No orders of the defined types were placed in this encounter.   Follow-up: No follow-ups on file.  Susi Eric, FNP 10/09/2023 10:31  AM

## 2023-10-10 ENCOUNTER — Ambulatory Visit: Payer: 59 | Admitting: Obstetrics and Gynecology

## 2023-10-10 ENCOUNTER — Other Ambulatory Visit (HOSPITAL_COMMUNITY)
Admission: RE | Admit: 2023-10-10 | Discharge: 2023-10-10 | Disposition: A | Discharge status: Home / Self Care | Source: Ambulatory Visit | Attending: Obstetrics and Gynecology | Admitting: Obstetrics and Gynecology

## 2023-10-10 VITALS — BP 137/84 | HR 80 | Ht 67.0 in | Wt 217.0 lb

## 2023-10-10 DIAGNOSIS — Z01419 Encounter for gynecological examination (general) (routine) without abnormal findings: Secondary | ICD-10-CM | POA: Insufficient documentation

## 2023-10-10 DIAGNOSIS — Z124 Encounter for screening for malignant neoplasm of cervix: Secondary | ICD-10-CM | POA: Insufficient documentation

## 2023-10-10 DIAGNOSIS — Z1339 Encounter for screening examination for other mental health and behavioral disorders: Secondary | ICD-10-CM | POA: Diagnosis not present

## 2023-10-10 NOTE — Addendum Note (Signed)
 Addended by: Ardean Beat A on: 10/10/2023 08:58 AM   Modules accepted: Orders

## 2023-10-13 LAB — CYTOLOGY - PAP
Comment: NEGATIVE
High risk HPV: NEGATIVE

## 2023-11-26 ENCOUNTER — Encounter: Payer: Self-pay | Admitting: Family Medicine

## 2023-11-27 ENCOUNTER — Other Ambulatory Visit: Payer: Self-pay

## 2023-11-27 MED ORDER — SCOPOLAMINE 1 MG/3DAYS TD PT72: 1.0000 | MEDICATED_PATCH | TRANSDERMAL | 0 refills | Status: DC

## 2023-12-31 ENCOUNTER — Other Ambulatory Visit: Payer: Self-pay | Admitting: Family Medicine

## 2024-02-19 ENCOUNTER — Encounter: Payer: Self-pay | Admitting: Family Medicine

## 2024-02-19 ENCOUNTER — Ambulatory Visit: Admitting: Family Medicine

## 2024-02-19 ENCOUNTER — Ambulatory Visit: Attending: Family Medicine

## 2024-02-19 VITALS — BP 132/82 | HR 72 | Temp 98.0°F | Resp 16 | Ht 67.0 in | Wt 211.0 lb

## 2024-02-19 DIAGNOSIS — R002 Palpitations: Secondary | ICD-10-CM

## 2024-02-19 LAB — COMPREHENSIVE METABOLIC PANEL WITH GFR
ALT: 13 U/L (ref 0–35)
AST: 12 U/L (ref 0–37)
Albumin: 4.1 g/dL (ref 3.5–5.2)
Alkaline Phosphatase: 90 U/L (ref 39–117)
BUN: 19 mg/dL (ref 6–23)
CO2: 27 meq/L (ref 19–32)
Calcium: 9 mg/dL (ref 8.4–10.5)
Chloride: 102 meq/L (ref 96–112)
Creatinine, Ser: 0.88 mg/dL (ref 0.40–1.20)
GFR: 68.8 mL/min (ref >60.00)
Glucose, Bld: 97 mg/dL (ref 70–99)
Potassium: 4.2 meq/L (ref 3.5–5.1)
Sodium: 139 meq/L (ref 135–145)
Total Bilirubin: 0.8 mg/dL (ref 0.2–1.2)
Total Protein: 6.8 g/dL (ref 6.0–8.3)

## 2024-02-19 LAB — TSH: TSH: 3.19 u[IU]/mL (ref 0.35–5.50)

## 2024-02-19 LAB — CBC
HCT: 43 % (ref 36.0–46.0)
Hemoglobin: 14.7 g/dL (ref 12.0–15.0)
MCHC: 34.1 g/dL (ref 30.0–36.0)
MCV: 90.6 fl (ref 78.0–100.0)
Platelets: 388 K/uL (ref 150.0–400.0)
RBC: 4.75 Mil/uL (ref 3.87–5.11)
RDW: 13 % (ref 11.5–15.5)
WBC: 7.6 K/uL (ref 4.0–10.5)

## 2024-02-19 LAB — MAGNESIUM: Magnesium: 2.2 mg/dL (ref 1.5–2.5)

## 2024-02-19 NOTE — Progress Notes (Signed)
 CC: Palpitations  Wendy Chambers is a 68 y.o. female here for palpitations.  Length of issue: 1 week Seems to have gotten better, last happened 2 d ago.  No stress out of the ordinary.  She was working in front of a computer when it happens; usually after eating a bagel.  How long does it last: a few minutes Light headedness/passing out? No Chest pain/shortness of breath? No Hx of arrhythmia? No Medication changes/illicit substances? No Caffeine/alcohol intake? No change  Past Medical History:  Diagnosis Date   Arthritis    Asthma    Colon polyp 2010   Depression    History of chicken pox    childhood   History of migraine    Hypertension     BP 132/82 (BP Location: Left Arm, Patient Position: Sitting)   Pulse 72   Temp 98 F (36.7 C) (Oral)   Resp 16   Ht 5' 7 (1.702 m)   Wt 211 lb (95.7 kg)   SpO2 96%   BMI 33.05 kg/m  Gen: Awake, alert, appearing stated age Eyes: PERRLA Mouth: MMM Heart: RRR, no bruits, no LE edema Lungs: CTAB, no accessory muscle use Neuro: No cerebellar signs Abdomen: Bowel sounds present, soft, nontender, nondistended Psych: Age appropriate judgment and insight, mood/affect WNL  Palpitations - Plan: LONG TERM MONITOR XT (3-14 DAYS), CBC, Comprehensive metabolic panel with GFR, TSH, Magnesium  We talked about getting an EKG and decided against it as she has not had symptoms for 2 days and they are short-lived.  Check labs.  Check long-term monitor.  Could be anxiety.  Could be related to reflux even. F/u pending the above.  The patient voiced understanding and agreement to the plan.  Wendy Chambers 8:51 AM 02/19/24

## 2024-02-19 NOTE — Patient Instructions (Signed)
 Stay hydrated.  Give us  2-3 business days to get the results of your labs back.   Consider reflux as a possibility for this.   Let us  know if you need anything.

## 2024-02-19 NOTE — Progress Notes (Unsigned)
 EP to read.

## 2024-02-20 ENCOUNTER — Ambulatory Visit: Payer: Self-pay | Admitting: Family Medicine

## 2024-03-15 ENCOUNTER — Encounter: Payer: Self-pay | Admitting: Family Medicine

## 2024-03-15 ENCOUNTER — Ambulatory Visit: Admitting: Family Medicine

## 2024-03-15 VITALS — BP 136/80 | HR 91 | Temp 98.0°F | Resp 16 | Ht 67.0 in | Wt 213.6 lb

## 2024-03-15 DIAGNOSIS — M20011 Mallet finger of right finger(s): Secondary | ICD-10-CM | POA: Diagnosis not present

## 2024-03-15 DIAGNOSIS — I10 Essential (primary) hypertension: Secondary | ICD-10-CM | POA: Diagnosis not present

## 2024-03-15 DIAGNOSIS — E669 Obesity, unspecified: Secondary | ICD-10-CM

## 2024-03-15 DIAGNOSIS — F418 Other specified anxiety disorders: Secondary | ICD-10-CM | POA: Diagnosis not present

## 2024-03-15 MED ORDER — ZEPBOUND 12.5 MG/0.5ML ~~LOC~~ SOAJ: 12.5000 mg | SUBCUTANEOUS | 0 refills | Status: AC

## 2024-03-15 MED ORDER — ZEPBOUND 7.5 MG/0.5ML ~~LOC~~ SOAJ: 7.5000 mg | SUBCUTANEOUS | 0 refills | Status: AC

## 2024-03-15 MED ORDER — ZEPBOUND 15 MG/0.5ML ~~LOC~~ SOAJ: 15.0000 mg | SUBCUTANEOUS | 1 refills | Status: AC

## 2024-03-15 MED ORDER — ZEPBOUND 2.5 MG/0.5ML ~~LOC~~ SOAJ: 2.5000 mg | SUBCUTANEOUS | 0 refills | Status: AC

## 2024-03-15 MED ORDER — ZEPBOUND 5 MG/0.5ML ~~LOC~~ SOAJ: 5.0000 mg | SUBCUTANEOUS | 0 refills | Status: AC

## 2024-03-15 MED ORDER — ZEPBOUND 10 MG/0.5ML ~~LOC~~ SOAJ: 10.0000 mg | SUBCUTANEOUS | 0 refills | Status: AC

## 2024-03-15 NOTE — Patient Instructions (Addendum)
 Keep the diet clean and stay active.  Let me know if there are cost issues with your medicine.   If you do not hear anything about your referral in the next 1-2 weeks, call our office and ask for an update.  Let us  know if you need anything.  Please consider counseling. Contact [phone] to schedule an appointment or inquire about cost/insurance coverage.  [facility] located at [address], [place], [place].  Phone number = [phone].  Dr. Verdel Silk - Adult Psychiatry.    [facility] located at [address] [place], [place], [place]. Phone number = [phone].   [facility] located at [address], [place], [place].  Phone number = [phone].   [facility] located at [address] [place], [place], [place].  Phone number = [phone].

## 2024-03-15 NOTE — Progress Notes (Signed)
 Chief Complaint  Patient presents with   Follow-up    Follow Up    Subjective Wendy Chambers is a 68 y.o. female who presents for hypertension follow up. She does not monitor home blood pressures. She is compliant with medication- Norvasc  5 mg/d. Patient has these side effects of medication: none She is usually adhering to a healthy diet overall. Current exercise: walking No CP or SOB.   Obesity The patient finished a 51-month course of phentermine  in June.  She has gained a few pounds back.  She lost around 10 pounds.  She is interested in weight loss shots if covered by insurance.  Diet/exercise as above.  She has done online weight watchers in the past.  She has a history of depression.  She has come off of her Celexa  2 months ago.  1 week ago, she had a job change and has been having more depressive symptoms since then.  She is not seeing a therapist but is going to reach out to her old one.  No homicidal or suicidal ideation.  No self-medication.  1 month ago, the patient hit her dog on the backside and hurt her pinky finger on the right.  She is unable to extend the far part of it.  It is still painful.  No bruising, redness, swelling.  No neurologic signs or symptoms.   Past Medical History:  Diagnosis Date   Arthritis    Asthma    Colon polyp 2010   Depression    History of chicken pox    childhood   History of migraine    Hypertension     Exam BP 136/80 (BP Location: Left Arm, Patient Position: Sitting)   Pulse 91   Temp 98 F (36.7 C) (Oral)   Resp 16   Ht 5' 7 (1.702 m)   Wt 213 lb 9.6 oz (96.9 kg)   SpO2 97%   BMI 33.45 kg/m  General:  well developed, well nourished, in no apparent distress Heart: RRR, no bruits, no LE edema Lungs: clear to auscultation, no accessory muscle use MSK: Right fifth digit, DIP unable to extend, TTP over the DIP joint on the same digit Neuro: Sensation intact to touch on the right fifth pinky finger Psych: well oriented with  normal range of affect and appropriate judgment/insight  Obesity (BMI 30-39.9) - Plan: tirzepatide  (ZEPBOUND ) 10 MG/0.5ML Pen, tirzepatide  (ZEPBOUND ) 12.5 MG/0.5ML Pen, tirzepatide  (ZEPBOUND ) 15 MG/0.5ML Pen, tirzepatide  (ZEPBOUND ) 2.5 MG/0.5ML Pen, tirzepatide  (ZEPBOUND ) 5 MG/0.5ML Pen, tirzepatide  (ZEPBOUND ) 7.5 MG/0.5ML Pen  Essential hypertension  Mallet finger of right hand - Plan: Ambulatory referral to Hand Surgery  Chronic, not controlled.  Start Zepbound  2.5 mg weekly and titrate up.  She will let me know if there are cost issues.  Counseled on diet and exercise. F/u in 3 mo. Chronic, stable.  Continue Norvasc  5 mg daily. Refer to the hand team.  Tried to splint her in the office without relief. Counseling information provided.  She will let me know how things go and if she wants to go back on citalopram . The patient voiced understanding and agreement to the plan.  Mabel [place], DO 03/15/24  4:50 PM

## 2024-03-19 DIAGNOSIS — R002 Palpitations: Secondary | ICD-10-CM

## 2024-06-14 ENCOUNTER — Ambulatory Visit: Admitting: Family Medicine
